# Patient Record
Sex: Female | Born: 1945 | Race: White | Hispanic: No | Marital: Married | Smoking: Never smoker
Health system: Southern US, Community
[De-identification: ages and names within clinical notes are randomized; demographics above are authoritative.]

## PROBLEM LIST (undated history)

## (undated) DIAGNOSIS — R112 Nausea with vomiting, unspecified: Secondary | ICD-10-CM

## (undated) DIAGNOSIS — J309 Allergic rhinitis, unspecified: Secondary | ICD-10-CM

## (undated) DIAGNOSIS — Z9889 Other specified postprocedural states: Secondary | ICD-10-CM

## (undated) DIAGNOSIS — C801 Malignant (primary) neoplasm, unspecified: Secondary | ICD-10-CM

## (undated) DIAGNOSIS — F419 Anxiety disorder, unspecified: Secondary | ICD-10-CM

## (undated) DIAGNOSIS — T8859XA Other complications of anesthesia, initial encounter: Secondary | ICD-10-CM

## (undated) DIAGNOSIS — T4145XA Adverse effect of unspecified anesthetic, initial encounter: Secondary | ICD-10-CM

## (undated) DIAGNOSIS — D696 Thrombocytopenia, unspecified: Secondary | ICD-10-CM

## (undated) DIAGNOSIS — R232 Flushing: Secondary | ICD-10-CM

## (undated) HISTORY — PX: PLANTAR FASCIA SURGERY: SHX746

## (undated) HISTORY — PX: LIMBAL STEM CELL TRANSPLANT: SHX1969

## (undated) HISTORY — PX: ABDOMINAL HYSTERECTOMY: SHX81

## (undated) HISTORY — PX: BACK SURGERY: SHX140

---

## 1998-07-06 ENCOUNTER — Other Ambulatory Visit: Admission: RE | Admit: 1998-07-06 | Discharge: 1998-07-06 | Payer: Self-pay | Admitting: *Deleted

## 1999-07-12 ENCOUNTER — Other Ambulatory Visit: Admission: RE | Admit: 1999-07-12 | Discharge: 1999-07-12 | Payer: Self-pay | Admitting: *Deleted

## 1999-12-13 ENCOUNTER — Encounter: Payer: Self-pay | Admitting: *Deleted

## 1999-12-13 ENCOUNTER — Encounter: Admission: RE | Admit: 1999-12-13 | Discharge: 1999-12-13 | Payer: Self-pay | Admitting: *Deleted

## 2000-07-17 ENCOUNTER — Other Ambulatory Visit: Admission: RE | Admit: 2000-07-17 | Discharge: 2000-07-17 | Payer: Self-pay | Admitting: *Deleted

## 2001-02-27 ENCOUNTER — Encounter: Admission: RE | Admit: 2001-02-27 | Discharge: 2001-02-27 | Payer: Self-pay | Admitting: *Deleted

## 2001-02-27 ENCOUNTER — Encounter: Payer: Self-pay | Admitting: *Deleted

## 2001-04-10 ENCOUNTER — Ambulatory Visit (HOSPITAL_COMMUNITY): Admission: RE | Admit: 2001-04-10 | Discharge: 2001-04-10 | Payer: Self-pay | Admitting: Family Medicine

## 2001-04-10 ENCOUNTER — Encounter: Payer: Self-pay | Admitting: Family Medicine

## 2001-04-11 ENCOUNTER — Encounter: Payer: Self-pay | Admitting: Family Medicine

## 2001-04-11 ENCOUNTER — Ambulatory Visit (HOSPITAL_COMMUNITY): Admission: RE | Admit: 2001-04-11 | Discharge: 2001-04-11 | Payer: Self-pay | Admitting: Family Medicine

## 2001-06-26 ENCOUNTER — Ambulatory Visit (HOSPITAL_COMMUNITY): Admission: RE | Admit: 2001-06-26 | Discharge: 2001-06-26 | Payer: Self-pay | Admitting: Gastroenterology

## 2001-06-26 ENCOUNTER — Encounter (INDEPENDENT_AMBULATORY_CARE_PROVIDER_SITE_OTHER): Payer: Self-pay | Admitting: Specialist

## 2001-08-15 ENCOUNTER — Other Ambulatory Visit: Admission: RE | Admit: 2001-08-15 | Discharge: 2001-08-15 | Payer: Self-pay | Admitting: *Deleted

## 2001-12-26 ENCOUNTER — Ambulatory Visit (HOSPITAL_COMMUNITY): Admission: RE | Admit: 2001-12-26 | Discharge: 2001-12-26 | Payer: Self-pay | Admitting: Family Medicine

## 2001-12-26 ENCOUNTER — Encounter: Payer: Self-pay | Admitting: Family Medicine

## 2002-05-22 ENCOUNTER — Encounter: Admission: RE | Admit: 2002-05-22 | Discharge: 2002-05-22 | Payer: Self-pay | Admitting: *Deleted

## 2002-10-06 ENCOUNTER — Encounter: Admission: RE | Admit: 2002-10-06 | Discharge: 2002-11-04 | Payer: Self-pay

## 2003-06-12 ENCOUNTER — Encounter: Admission: RE | Admit: 2003-06-12 | Discharge: 2003-06-12 | Payer: Self-pay | Admitting: *Deleted

## 2003-07-16 ENCOUNTER — Other Ambulatory Visit: Admission: RE | Admit: 2003-07-16 | Discharge: 2003-07-16 | Payer: Self-pay | Admitting: *Deleted

## 2004-02-09 ENCOUNTER — Encounter: Admission: RE | Admit: 2004-02-09 | Discharge: 2004-02-09 | Payer: Self-pay | Admitting: *Deleted

## 2004-06-07 ENCOUNTER — Ambulatory Visit (HOSPITAL_BASED_OUTPATIENT_CLINIC_OR_DEPARTMENT_OTHER): Admission: RE | Admit: 2004-06-07 | Discharge: 2004-06-07 | Payer: Self-pay | Admitting: *Deleted

## 2004-06-21 ENCOUNTER — Encounter: Admission: RE | Admit: 2004-06-21 | Discharge: 2004-06-21 | Payer: Self-pay | Admitting: *Deleted

## 2005-05-25 ENCOUNTER — Ambulatory Visit: Payer: Self-pay | Admitting: Family Medicine

## 2005-07-05 ENCOUNTER — Ambulatory Visit: Payer: Self-pay | Admitting: Family Medicine

## 2005-07-27 ENCOUNTER — Encounter: Admission: RE | Admit: 2005-07-27 | Discharge: 2005-07-27 | Payer: Self-pay | Admitting: *Deleted

## 2005-08-31 ENCOUNTER — Ambulatory Visit: Payer: Self-pay | Admitting: Family Medicine

## 2006-02-01 ENCOUNTER — Ambulatory Visit: Payer: Self-pay | Admitting: Family Medicine

## 2006-03-15 ENCOUNTER — Ambulatory Visit: Payer: Self-pay | Admitting: Family Medicine

## 2006-03-21 ENCOUNTER — Ambulatory Visit: Payer: Self-pay | Admitting: Family Medicine

## 2006-03-22 ENCOUNTER — Encounter (HOSPITAL_COMMUNITY): Admission: RE | Admit: 2006-03-22 | Discharge: 2006-06-20 | Payer: Self-pay | Admitting: Family Medicine

## 2006-03-26 ENCOUNTER — Ambulatory Visit: Payer: Self-pay | Admitting: Family Medicine

## 2006-04-05 ENCOUNTER — Ambulatory Visit: Payer: Self-pay | Admitting: Family Medicine

## 2006-04-06 ENCOUNTER — Ambulatory Visit: Payer: Self-pay | Admitting: Cardiology

## 2006-04-26 ENCOUNTER — Ambulatory Visit: Payer: Self-pay

## 2006-04-26 ENCOUNTER — Encounter: Payer: Self-pay | Admitting: Cardiology

## 2006-11-08 ENCOUNTER — Ambulatory Visit: Payer: Self-pay | Admitting: Family Medicine

## 2006-11-12 ENCOUNTER — Ambulatory Visit: Payer: Self-pay | Admitting: Family Medicine

## 2007-01-01 ENCOUNTER — Ambulatory Visit: Payer: Self-pay | Admitting: Family Medicine

## 2007-01-09 ENCOUNTER — Ambulatory Visit: Payer: Self-pay | Admitting: Oncology

## 2007-03-19 ENCOUNTER — Ambulatory Visit (HOSPITAL_BASED_OUTPATIENT_CLINIC_OR_DEPARTMENT_OTHER): Admission: RE | Admit: 2007-03-19 | Discharge: 2007-03-19 | Payer: Self-pay | Admitting: Orthopaedic Surgery

## 2007-03-19 ENCOUNTER — Encounter (INDEPENDENT_AMBULATORY_CARE_PROVIDER_SITE_OTHER): Payer: Self-pay | Admitting: Specialist

## 2007-03-28 ENCOUNTER — Ambulatory Visit: Payer: Self-pay | Admitting: Oncology

## 2007-03-29 ENCOUNTER — Ambulatory Visit (HOSPITAL_COMMUNITY): Admission: RE | Admit: 2007-03-29 | Discharge: 2007-03-29 | Payer: Self-pay | Admitting: Orthopaedic Surgery

## 2007-04-02 LAB — CBC & DIFF AND RETIC
BASO%: 0.7 % (ref 0.0–2.0)
Basophils Absolute: 0 10*3/uL (ref 0.0–0.1)
EOS%: 2.4 % (ref 0.0–7.0)
Eosinophils Absolute: 0.1 10*3/uL (ref 0.0–0.5)
MONO#: 0.3 10*3/uL (ref 0.1–0.9)
MONO%: 8.3 % (ref 0.0–13.0)
NEUT#: 2.4 10*3/uL (ref 1.5–6.5)
NEUT%: 63.3 % (ref 39.6–76.8)
RBC: 4.22 10*6/uL (ref 3.70–5.32)
Retic %: 0.5 % (ref 0.4–2.3)

## 2007-04-02 LAB — MORPHOLOGY

## 2007-04-04 LAB — COMPREHENSIVE METABOLIC PANEL
ALT: 13 U/L (ref 0–35)
AST: 14 U/L (ref 0–37)
BUN: 26 mg/dL — ABNORMAL HIGH (ref 6–23)
Calcium: 9.7 mg/dL (ref 8.4–10.5)
Potassium: 4.3 mEq/L (ref 3.5–5.3)
Sodium: 143 mEq/L (ref 135–145)
Total Protein: 7.7 g/dL (ref 6.0–8.3)

## 2007-04-04 LAB — LACTATE DEHYDROGENASE: LDH: 149 U/L (ref 94–250)

## 2007-04-04 LAB — IMMUNOFIXATION ELECTROPHORESIS: IgM, Serum: 388 mg/dL — ABNORMAL HIGH (ref 60–263)

## 2007-04-04 LAB — FERRITIN: Ferritin: 164 ng/mL (ref 10–291)

## 2007-04-17 LAB — PROTHROMBIN TIME
INR: 1 (ref 0.0–1.5)
Prothrombin Time: 13.3 seconds (ref 11.6–15.2)

## 2007-04-17 LAB — IVY BLEEDING TIME: Bleeding Time: 6.5 Minutes (ref 2.0–8.0)

## 2007-04-22 LAB — VON WILLEBRAND PANEL
Factor-VIII Activity: 59 % — ABNORMAL LOW (ref 75–150)
Ristocetin-Cofactor: 64 % (ref 50–150)
Von Willebrand Ag: 61 % normal (ref 61–164)

## 2007-05-10 ENCOUNTER — Ambulatory Visit: Payer: Self-pay | Admitting: Oncology

## 2007-05-14 ENCOUNTER — Encounter (HOSPITAL_COMMUNITY): Admission: RE | Admit: 2007-05-14 | Discharge: 2007-07-16 | Payer: Self-pay | Admitting: Oncology

## 2007-05-14 LAB — MORPHOLOGY
PLT EST: ADEQUATE
RBC Comments: NORMAL

## 2007-05-14 LAB — CBC WITH DIFFERENTIAL/PLATELET
EOS%: 2 % (ref 0.0–7.0)
Eosinophils Absolute: 0.1 10*3/uL (ref 0.0–0.5)
HCT: 33.3 % — ABNORMAL LOW (ref 34.8–46.6)
LYMPH%: 39.1 % (ref 14.0–48.0)
MCH: 31.8 pg (ref 26.0–34.0)
MCV: 90.8 fL (ref 81.0–101.0)
MONO%: 9.1 % (ref 0.0–13.0)
Platelets: 158 10*3/uL (ref 145–400)

## 2007-05-15 LAB — RHEUMATOID FACTOR: Rhuematoid fact SerPl-aCnc: <20 IU/mL (ref 0–20)

## 2007-05-17 LAB — ABO/RH

## 2007-05-18 LAB — VON WILLEBRAND FACTOR MULTIMER
Factor-VIII Activity: 48 % — ABNORMAL LOW (ref 75–150)
Ristocetin-Cofactor: 47 % — ABNORMAL LOW (ref 50–150)
Von Willebrand Ag: 51 % normal — ABNORMAL LOW (ref 61–164)

## 2007-06-13 LAB — VON WILLEBRAND PANEL
Factor-VIII Activity: 150 % (ref 75–150)
Ristocetin-Cofactor: >150 % — ABNORMAL HIGH (ref 50–150)

## 2007-06-18 ENCOUNTER — Ambulatory Visit (HOSPITAL_COMMUNITY): Admission: RE | Admit: 2007-06-18 | Discharge: 2007-06-18 | Payer: Self-pay | Admitting: Orthopaedic Surgery

## 2007-08-14 ENCOUNTER — Ambulatory Visit: Payer: Self-pay | Admitting: Oncology

## 2007-08-16 LAB — CBC WITH DIFFERENTIAL/PLATELET
BASO%: 0.6 % (ref 0.0–2.0)
Eosinophils Absolute: 0.1 10*3/uL (ref 0.0–0.5)
LYMPH%: 41 % (ref 14.0–48.0)
MONO#: 0.3 10*3/uL (ref 0.1–0.9)
NEUT#: 1.3 10*3/uL — ABNORMAL LOW (ref 1.5–6.5)
Platelets: 147 10*3/uL (ref 145–400)
RBC: 3.69 10*6/uL — ABNORMAL LOW (ref 3.70–5.32)
WBC: 2.7 10*3/uL — ABNORMAL LOW (ref 3.9–10.0)
lymph#: 1.1 10*3/uL (ref 0.9–3.3)

## 2007-08-16 LAB — MORPHOLOGY: PLT EST: ADEQUATE

## 2007-08-19 LAB — ANA: Anti Nuclear Antibody(ANA): POSITIVE — AB

## 2007-08-19 LAB — APTT: aPTT: 37 seconds (ref 24–37)

## 2007-08-19 LAB — ANTI-NUCLEAR AB-TITER (ANA TITER): ANA Titer 1: 1:80 {titer} — ABNORMAL HIGH

## 2007-11-18 ENCOUNTER — Ambulatory Visit: Payer: Self-pay | Admitting: Oncology

## 2007-11-20 LAB — CHCC SMEAR

## 2007-11-20 LAB — CBC WITH DIFFERENTIAL/PLATELET
Basophils Absolute: 0 10*3/uL (ref 0.0–0.1)
EOS%: 1.9 % (ref 0.0–7.0)
HGB: 11.7 g/dL (ref 11.6–15.9)
MCH: 31.1 pg (ref 26.0–34.0)
MCHC: 34.9 g/dL (ref 32.0–36.0)
MCV: 89.3 fL (ref 81.0–101.0)
MONO%: 7.5 % (ref 0.0–13.0)
RBC: 3.76 10*6/uL (ref 3.70–5.32)
RDW: 13.8 % (ref 11.3–14.5)

## 2007-11-20 LAB — MORPHOLOGY: PLT EST: ADEQUATE

## 2008-02-20 ENCOUNTER — Ambulatory Visit: Payer: Self-pay | Admitting: Oncology

## 2008-02-20 ENCOUNTER — Encounter: Payer: Self-pay | Admitting: Oncology

## 2008-02-20 ENCOUNTER — Ambulatory Visit (HOSPITAL_COMMUNITY): Admission: RE | Admit: 2008-02-20 | Discharge: 2008-02-20 | Payer: Self-pay | Admitting: Oncology

## 2008-03-11 ENCOUNTER — Encounter: Admission: RE | Admit: 2008-03-11 | Discharge: 2008-03-11 | Payer: Self-pay | Admitting: Family Medicine

## 2009-06-01 ENCOUNTER — Encounter: Admission: RE | Admit: 2009-06-01 | Discharge: 2009-06-01 | Payer: Self-pay | Admitting: Family Medicine

## 2011-01-01 ENCOUNTER — Encounter: Payer: Self-pay | Admitting: Family Medicine

## 2011-04-25 NOTE — Op Note (Signed)
Audrey Adams, Audrey Adams              ACCOUNT NO.:  [id number]   MEDICAL RECORD NO.:  [id number]          PATIENT TYPE:  AMB   LOCATION:  SDS                          FACILITY:  [facility]   PHYSICIAN:  Lubertha Basque. Dalldorf, M.D.DATE OF BIRTH:  1946-10-12   DATE OF PROCEDURE:  06/18/2007  DATE OF DISCHARGE:                               OPERATIVE REPORT   PREOPERATIVE DIAGNOSIS:  Right heel plantar fasciitis.   POSTOPERATIVE DIAGNOSIS:  Right heel plantar fasciitis.   PROCEDURE:  Right heel endoscopic plantar fascia release.   ANESTHESIA:  General.   ATTENDING SURGEON:  Lubertha Basque. Jerl Santos, M.D.   ASSISTANT:  Lindwood Qua, P.A.   INDICATIONS FOR PROCEDURE:  The patient is a 65 year old woman with a  many-year history of bilateral foot pain.  This has persisted despite  all known conservative means of treatment including bracing, pads,  orthotics, physical therapy, ultrasound treatments, and multiple  injections.  She is status post a successful procedure on the opposite  foot which was an endoscopic plantar fascia release along with a neuroma  excision.  She did have some bleeding complications related to the  neuroma and has since been diagnosed with Von Willebrand's disease.  She  is now offered an endoscopic plantar fascia release on the right where  she has a great deal of heel pain.  Informed operative consent was  obtained after discussion of the possible complications of reaction to  anesthesia, infection, neurovascular injury, and obviously bleeding.  At  the direction of her hematologist she was administered some DDAVP prior  to this procedure in the holding area.   SUMMARY OF FINDINGS AND PROCEDURE:  Under general anesthesia through two  portals, an endoscopic plantar fascial release was performed on the  right.  She had findings consistent with chronic plantar fasciitis, and  this was released using the Topaz machine.   DESCRIPTION OF PROCEDURE:  The patient was taken to  the operating suite  where general anesthetic was applied without difficulty.  She was  positioned supine and prepped and draped in normal sterile fashion.  After administration of brief IV Kefzol and the aforementioned DDAVP,  the right leg was elevated, exsanguinated, and a tourniquet inflated  about the calf.  A medial portal incision was made near the plantar  fascia origin with dissection down to this structure.  An endoscope was  then placed above the fascia.  We then used the Topaz device to perform  perforations of the plantar fascia in 10 to 15 locations through the  endoscope.  The endoscope was removed.  I did release the medial border  of the plantar fascia with some scissors.  The tourniquet was deflated,  and a small amount of bleeding was easily controlled with pressure.  The  toes all became pink and warm immediately.  We reapproximated the  portals with simple sutures of nylon followed by Adaptic and a dry gauze  dressing with a loose Ace wrap.  Estimated blood loss and intraoperative  fluids can be obtained from anesthesia records.   DISPOSITION:  The patient was extubated in the operating room and  taken  to the recovery room in stable condition.  She was to go home same-day  and follow up in the office next week.  I will contact her by phone  tonight.      Lubertha Basque Jerl Santos, M.D.  Electronically Signed     PGD/MEDQ  D:  06/18/2007  T:  06/18/2007  Job:  [id number]

## 2011-04-25 NOTE — Op Note (Signed)
NAMELANDRIE, BEALE NO.:  [id number]   MEDICAL RECORD NO.:  [id number]          PATIENT TYPE:  OUT   LOCATION:  OMED                         FACILITY:  [facility]   PHYSICIAN:  Genene Churn. Granfortuna, M.D.DATE OF BIRTH:  Apr 05, 1946   DATE OF PROCEDURE:  02/20/2008  DATE OF DISCHARGE:                               OPERATIVE REPORT   PROCEDURE:  Right posterior iliac crest bone marrow aspiration and  biopsy done with local 2% Lidocaine anesthesia and 5 mg IV Versed  premedication without complication. Pre-procedure evaluation of airway  reveals class I airway.   INDICATIONS FOR PROCEDURE:  Unexplained leukopenia in a 65 year old  woman who also has a diagnosis of type 1 von Willebrand's disorder.  There were no complications.      Genene Churn. Cyndie Chime, M.D.  Electronically Signed     JMG/MEDQ  D:  02/20/2008  T:  02/20/2008  Job:  [id number]

## 2011-04-28 NOTE — Op Note (Signed)
NAMEANASTACIA, REINECKE              ACCOUNT NO.:  [id number]   MEDICAL RECORD NO.:  [id number]          PATIENT TYPE:  AMB   LOCATION:  DSC                          FACILITY:  [facility]   PHYSICIAN:  Lubertha Basque. Dalldorf, M.D.DATE OF BIRTH:  Jan 13, 1946   DATE OF PROCEDURE:  03/19/2007  DATE OF DISCHARGE:                               OPERATIVE REPORT   PREOPERATIVE DIAGNOSES:  1. Left foot Morton's neuroma.  2. Left foot plantar fasciitis.  3. Left thumb basal joint degeneration.   POSTOPERATIVE DIAGNOSES:  1. Left foot Morton's neuroma.  2. Left foot plantar fasciitis.  3. Left thumb basal joint degeneration.   PROCEDURE:  1. Left foot third interspace Morton's neuroma excision.  2. Left foot endoscopic plantar fascial release.  3. Left thumb basal joint injection.   ANESTHESIA:  General.   ATTENDING SURGEON:  Lubertha Basque. Jerl Santos, M.D.   ASSISTANT:  Lindwood Qua, P.A.   INDICATIONS FOR PROCEDURE:  The patient is a 65 year old woman with a  long history of left foot pain.  She has persisted with difficulty  despite oral anti-inflammatories which led to an ulcer.  She has also  had several sets of orthotics reworked.  She has had three injections in  the plantar fascial region.  She has pain which continues to limit her  ability to walk.  She also has forefoot pain in the third interspace and  difficulty wearing shoes.  She is offered an endoscopic plantar fascial  release and excision of her neuroma in hopes of ameliorating her  symptoms somewhat.  We did discuss the risks of anesthesia, infection,  neurovascular injury as well as recurrence.  She also wishes to have an  injection about her arthritic basal joint at the left thumb.   SUMMARY, FINDINGS, AND PROCEDURE:  Under general anesthesia, several  procedures were performed.  After sterile prep, we injected the basal  joint at the left thumb with Depo-Medrol and lidocaine.  We then  performed an endoscopic plantar fascial  release and did utilize the  [employer].  I then removed a neuroma from the third interspace  through a dorsal incision.  This was sent to pathology.   DESCRIPTION OF PROCEDURE:  The patient was taken to the operating suite  where general anesthetic was applied without difficulty.  She was  positioned supine and prepped and draped in the normal sterile fashion.  After administration of IV Kefzol, an injection of the left basal joint  was done after an alcohol prep.  We placed 1 mL of Depo-Medrol and a  small amount of lidocaine.  A Band-Aid was applied.  We then turned our  attention to her foot.  A small medial incision was made in the area of  the plantar fascia, and a trocar was introduced across to the lateral  border of the heel, and then a small incision was made there to allow  passage of the trocar out that side of the heel.  We visualized the  plantar fascia well.  I then used the Topaz machine by [employer] and  utilized this to stimulate the plantar  fascia in several areas.  I also  used some scissors to release the medial border of the plantar fascia.  The endoscope was then removed.  We then made a dorsal incision in the  third interspace, with dissection down to a very prominent Morton's  neuroma.  I removed this from just proximal to the metatarsal head out  past the bifurcation.  This was sent to pathology in formalin.  We did  inflate a tourniquet at the beginning of the case about the calf, and I  released that at this point.  A small amount of bleeding was easily  controlled with pressure and Bovie cautery.  Simple sutures of nylon  were used to loosely reapproximate the plantar fascial incisions, and  nylon was also used to reapproximate the skin at the Morton's neuroma  incision site.  Adaptic was placed on the wounds followed by dry gauze  and a loose Ace wrap.  Estimated blood loss, intraoperative fluids, as  well as accurate tourniquet time can be obtained from  anesthesia  records.   DISPOSITION:  The patient was extubated in the operating room and taken  to the recovery in stable addition.  She was to go home same-day and  follow up in the office next week.  I will contact her by phone tonight.      Lubertha Basque Jerl Santos, M.D.  Electronically Signed     PGD/MEDQ  D:  03/19/2007  T:  03/19/2007  Job:  [id number]

## 2011-05-22 ENCOUNTER — Ambulatory Visit: Payer: Self-pay | Admitting: Physical Therapy

## 2011-05-29 ENCOUNTER — Ambulatory Visit: Payer: BC Managed Care – PPO | Attending: Internal Medicine | Admitting: Physical Therapy

## 2011-05-29 DIAGNOSIS — M545 Low back pain, unspecified: Secondary | ICD-10-CM | POA: Insufficient documentation

## 2011-05-29 DIAGNOSIS — IMO0001 Reserved for inherently not codable concepts without codable children: Secondary | ICD-10-CM | POA: Insufficient documentation

## 2011-05-29 DIAGNOSIS — M546 Pain in thoracic spine: Secondary | ICD-10-CM | POA: Insufficient documentation

## 2011-05-29 DIAGNOSIS — R5381 Other malaise: Secondary | ICD-10-CM | POA: Insufficient documentation

## 2011-05-31 ENCOUNTER — Ambulatory Visit: Payer: BC Managed Care – PPO | Admitting: Physical Therapy

## 2011-06-05 ENCOUNTER — Ambulatory Visit: Payer: BC Managed Care – PPO | Admitting: Physical Therapy

## 2011-06-08 ENCOUNTER — Ambulatory Visit: Payer: BC Managed Care – PPO | Admitting: Physical Therapy

## 2011-06-15 ENCOUNTER — Ambulatory Visit: Payer: BC Managed Care – PPO | Attending: Internal Medicine | Admitting: *Deleted

## 2011-06-15 DIAGNOSIS — M545 Low back pain, unspecified: Secondary | ICD-10-CM | POA: Insufficient documentation

## 2011-06-15 DIAGNOSIS — M546 Pain in thoracic spine: Secondary | ICD-10-CM | POA: Insufficient documentation

## 2011-06-15 DIAGNOSIS — IMO0001 Reserved for inherently not codable concepts without codable children: Secondary | ICD-10-CM | POA: Insufficient documentation

## 2011-06-15 DIAGNOSIS — R5381 Other malaise: Secondary | ICD-10-CM | POA: Insufficient documentation

## 2011-06-20 ENCOUNTER — Ambulatory Visit: Payer: BC Managed Care – PPO | Admitting: Physical Therapy

## 2011-09-04 LAB — CBC
HCT: 35.1 — ABNORMAL LOW
Hemoglobin: 12
RBC: 3.92

## 2011-09-04 LAB — DIFFERENTIAL
Eosinophils Relative: 1
Lymphocytes Relative: 34
Lymphs Abs: 1
Monocytes Absolute: 0.2
Monocytes Relative: 7
Neutro Abs: 1.7

## 2011-09-04 LAB — TISSUE HYBRIDIZATION (BONE MARROW)-NCBH

## 2011-09-26 LAB — CBC
HCT: 34.7 — ABNORMAL LOW
Hemoglobin: 11.9 — ABNORMAL LOW
MCV: 90.4
RBC: 3.84 — ABNORMAL LOW
WBC: 3 — ABNORMAL LOW

## 2011-09-26 LAB — BASIC METABOLIC PANEL
Chloride: 105
GFR calc Af Amer: >60
Potassium: 4.5
Sodium: 139

## 2011-09-28 LAB — ABO/RH: ABO/RH(D): O POS

## 2012-03-04 ENCOUNTER — Encounter (HOSPITAL_BASED_OUTPATIENT_CLINIC_OR_DEPARTMENT_OTHER): Payer: BC Managed Care – PPO

## 2015-02-19 ENCOUNTER — Emergency Department (HOSPITAL_BASED_OUTPATIENT_CLINIC_OR_DEPARTMENT_OTHER)
Admission: EM | Admit: 2015-02-19 | Discharge: 2015-02-19 | Disposition: A | Discharge status: Home / Self Care | Payer: Medicare Other | Attending: Emergency Medicine | Admitting: Emergency Medicine

## 2015-02-19 ENCOUNTER — Encounter (HOSPITAL_BASED_OUTPATIENT_CLINIC_OR_DEPARTMENT_OTHER): Payer: Self-pay | Admitting: *Deleted

## 2015-02-19 ENCOUNTER — Emergency Department (HOSPITAL_BASED_OUTPATIENT_CLINIC_OR_DEPARTMENT_OTHER): Payer: Medicare Other

## 2015-02-19 DIAGNOSIS — Z87448 Personal history of other diseases of urinary system: Secondary | ICD-10-CM | POA: Diagnosis not present

## 2015-02-19 DIAGNOSIS — J209 Acute bronchitis, unspecified: Secondary | ICD-10-CM | POA: Insufficient documentation

## 2015-02-19 DIAGNOSIS — J40 Bronchitis, not specified as acute or chronic: Secondary | ICD-10-CM

## 2015-02-19 DIAGNOSIS — Z8659 Personal history of other mental and behavioral disorders: Secondary | ICD-10-CM | POA: Insufficient documentation

## 2015-02-19 DIAGNOSIS — R05 Cough: Secondary | ICD-10-CM | POA: Diagnosis present

## 2015-02-19 DIAGNOSIS — Z8572 Personal history of non-Hodgkin lymphomas: Secondary | ICD-10-CM | POA: Insufficient documentation

## 2015-02-19 HISTORY — DX: Anxiety disorder, unspecified: F41.9

## 2015-02-19 HISTORY — DX: Flushing: R23.2

## 2015-02-19 HISTORY — DX: Malignant (primary) neoplasm, unspecified: C80.1

## 2015-02-19 HISTORY — DX: Allergic rhinitis, unspecified: J30.9

## 2015-02-19 MED ORDER — ALBUTEROL SULFATE HFA 108 (90 BASE) MCG/ACT IN AERS
1.0000 | INHALATION_SPRAY | RESPIRATORY_TRACT | Status: DC | PRN
  Filled 2015-02-19 (×2): qty 6.7

## 2015-02-19 MED ORDER — AZITHROMYCIN 250 MG PO TABS: 250.0000 mg | ORAL_TABLET | Freq: Every day | ORAL | Status: DC

## 2015-02-19 MED ORDER — CEFTRIAXONE SODIUM 1 G IJ SOLR
1.0000 g | Freq: Once | INTRAMUSCULAR | Status: AC
  Administered 2015-02-19: 1 g via INTRAMUSCULAR
  Filled 2015-02-19: qty 10

## 2015-02-19 MED ORDER — LIDOCAINE HCL (PF) 1 % IJ SOLN
INTRAMUSCULAR | Status: AC
  Filled 2015-02-19: qty 5

## 2015-02-19 NOTE — ED Notes (Signed)
Cough and congestion x 4 days. [facility] further evaluation.

## 2015-02-20 NOTE — ED Provider Notes (Signed)
CSN: [id number]     Arrival date & time 02/19/15  1124 History   First MD Initiated Contact with Patient 02/19/15 1505     Chief Complaint  Patient presents with  . Cough     (Consider location/radiation/quality/duration/timing/severity/associated sxs/prior Treatment) Patient is a 69 y.o. female presenting with cough. The history is provided by the patient.  Cough Cough characteristics:  Productive Sputum characteristics:  Yellow Severity:  Mild Onset quality:  Gradual Duration:  4 days Timing:  Constant Progression:  Worsening Chronicity:  New Smoker: no   Context: sick contacts and upper respiratory infection   Relieved by:  Nothing Worsened by:  Nothing tried Ineffective treatments: nasal saline. Associated symptoms: fever, rhinorrhea, shortness of breath and sinus congestion   Associated symptoms: no chest pain, no chills, no headaches and no wheezing   Risk factors: no recent infection and no recent travel     Past Medical History  Diagnosis Date  . Anxiety   . Hot flashes   . Allergic rhinitis   . Cancer     lymphoma 2011   Past Surgical History  Procedure Laterality Date  . Limbal stem cell transplant    . Abdominal hysterectomy    . Back surgery     No family history on file. History  Substance Use Topics  . Smoking status: Never Smoker   . Smokeless tobacco: Not on file  . Alcohol Use: No   OB History    No data available     Review of Systems  Constitutional: Positive for fever. Negative for chills.  HENT: Positive for postnasal drip and rhinorrhea.   Respiratory: Positive for cough and shortness of breath. Negative for wheezing.   Cardiovascular: Negative for chest pain.  Neurological: Negative for headaches.  All other systems reviewed and are negative.     Allergies  Allopurinol; Bactrim; Codeine; Percocet; and Tegretol  Home Medications   Prior to Admission medications   Medication Sig Start Date End Date Taking? Authorizing  Provider  azithromycin (ZITHROMAX) 250 MG tablet Take 1 tablet (250 mg total) by mouth daily. Take first 2 tablets together, then 1 every day until finished. 02/19/15   Blanchie Dessert, MD   BP 124/78 mmHg  Pulse 67  Temp(Src) 98.4 F (36.9 C) (Oral)  Resp 16  Ht 5\' 10"  (1.778 m)  Wt 160 lb (72.576 kg)  BMI 22.96 kg/m2  SpO2 100% Physical Exam  Constitutional: She is oriented to person, place, and time. She appears well-developed and well-nourished. No distress.  HENT:  Head: Normocephalic and atraumatic.  Right Ear: Tympanic membrane and ear canal normal.  Left Ear: Tympanic membrane and ear canal normal.  Nose: Mucosal edema and rhinorrhea present.  Mouth/Throat: Oropharynx is clear and moist and mucous membranes are normal.  Eyes: EOM are normal. Pupils are equal, round, and reactive to light.  Cardiovascular: Normal rate, regular rhythm, normal heart sounds and intact distal pulses.  Exam reveals no friction rub.   No murmur heard. Pulmonary/Chest: Effort normal and breath sounds normal. She has no wheezes. She has no rales. She exhibits no tenderness.  Abdominal: Soft. Bowel sounds are normal. She exhibits no distension. There is no tenderness. There is no rebound and no guarding.  Musculoskeletal: Normal range of motion. She exhibits no tenderness.  No edema  Neurological: She is alert and oriented to person, place, and time. No cranial nerve deficit.  Skin: Skin is warm and dry. No rash noted.  Psychiatric: She has a normal mood  and affect. Her behavior is normal.  Nursing note and vitals reviewed.   ED Course  Procedures (including critical care time) Labs Review Labs Reviewed - No data to display  Imaging Review Dg Chest 2 View  02/19/2015   CLINICAL DATA:  Cough and congestion for 4 days, history of prior lymphoma  EXAM: CHEST  2 VIEW  COMPARISON:  12/09/2013  FINDINGS: The heart size and mediastinal contours are within normal limits. Both lungs are clear. The  visualized skeletal structures are unremarkable.  IMPRESSION: No active cardiopulmonary disease.   Electronically Signed   By: Inez Catalina M.D.   On: 02/19/2015 12:44     EKG Interpretation None      MDM   Final diagnoses:  Bronchitis    Patient here with prior history of stem cell transplant who presents today with 4 days of worsening cough, congestion and now shortness of breath. Patient's vital signs are within normal limits currently afebrile here. Patient did receive a flu shot this year. Chest x-ray within normal limits. Patient given antibiotics and discharged home to follow-up with her regular doctor.    Blanchie Dessert, MD 02/20/15 [phone]

## 2016-02-10 ENCOUNTER — Other Ambulatory Visit: Payer: Self-pay | Admitting: Otolaryngology

## 2016-02-10 ENCOUNTER — Ambulatory Visit
Admission: RE | Admit: 2016-02-10 | Discharge: 2016-02-10 | Disposition: A | Discharge status: Home / Self Care | Payer: Medicare Other | Source: Ambulatory Visit | Attending: Otolaryngology | Admitting: Otolaryngology

## 2016-02-10 DIAGNOSIS — J018 Other acute sinusitis: Secondary | ICD-10-CM

## 2017-04-25 ENCOUNTER — Ambulatory Visit: Payer: Medicare Other | Attending: Orthopedic Surgery | Admitting: Physical Therapy

## 2017-04-25 DIAGNOSIS — M542 Cervicalgia: Secondary | ICD-10-CM | POA: Diagnosis not present

## 2017-04-25 DIAGNOSIS — R293 Abnormal posture: Secondary | ICD-10-CM

## 2017-04-25 NOTE — Therapy (Signed)
[facility] [place], [place], [address] Phone: [phone]   Fax:  [phone]  Physical Therapy Evaluation  Patient Details  Name: KALYNN DECLERCQ MRN: [id number] Date of Birth: September 03, 1946 Referring Provider: Tania Ade MD  Encounter Date: 04/25/2017      PT End of Session - 04/25/17 1404    Activity Tolerance Patient tolerated treatment well   Behavior During Therapy [facility] for tasks assessed/performed      Past Medical History:  Diagnosis Date  . Allergic rhinitis   . Anxiety   . Cancer    lymphoma 2011  . Hot flashes     Past Surgical History:  Procedure Laterality Date  . ABDOMINAL HYSTERECTOMY    . BACK SURGERY    . LIMBAL STEM CELL TRANSPLANT      There were no vitals filed for this visit.       Subjective Assessment - 04/25/17 1251    Subjective The patient presents to OPPT with c/o right sided neck pain.  She also reports tingling into her left hand.  Without medication her pain is an 9-10/10.  Increased activity increase pain and Ibuprofen   Pertinent History Previous right shoulder injury.   Diagnostic tests X-ry.   Patient Stated Goals Want to ger out of pain so I can do ADL's.   Currently in Pain? Yes   Pain Score 10-Worst pain ever   Pain Location Neck   Pain Orientation Right   Pain Descriptors / Indicators Aching;Tingling;Numbness   Pain Onset More than a month ago   Pain Frequency Constant   Aggravating Factors  See above.   Pain Relieving Factors See above.            [facility] PT Assessment - 04/25/17 0001      Assessment   Medical Diagnosis Cervical radiculopathy.   Referring Provider Tania Ade MD   Onset Date/Surgical Date --  4 weeks.   Hand Dominance Right     Precautions   Precautions None     Restrictions   Weight Bearing Restrictions No     Balance Screen   Has the patient fallen in the past 6 months No   Has the patient had a decrease in activity  level because of a fear of falling?  No   Is the patient reluctant to leave their home because of a fear of falling?  No     Home Environment   Living Environment Private residence     Prior Function   Level of Independence Independent     Posture/Postural Control   Posture/Postural Control Postural limitations   Postural Limitations Rounded Shoulders;Forward head     ROM / Strength   AROM / PROM / Strength AROM;Strength     AROM   Overall AROM Comments Left active cervical rotation= 60 degrees, right rotation= 60 degrees; right SBinf= 5 degrees and left SBing= 15 degrees.     Strength   Overall Strength Comments Right and left grip normal.  Right shoulder elbow and wrist strength is essentially normal.     Palpation   Palpation comment CC is pain with deep palpation along the right lateral lower cervical spine region.     Special Tests    Special Tests --  Bil Tricep reflexes decreased but others normal.     Ambulation/Gait   Gait Comments WNL.                   [facility] PT Treatment/Exercise -  04/25/17 0001      Modalities   Modalities Traction     Traction   Type of Traction Cervical   Min (lbs) 5   Max (lbs) 15   Hold Time 99   Rest Time 15                PT Education - 04/25/17 1305    Education provided Yes   Person(s) Educated Patient   Methods Explanation;Demonstration;Verbal cues   Comprehension Verbalized understanding;Returned demonstration          PT Short Term Goals - 04/25/17 1330      PT SHORT TERM GOAL #1   Title STG's=LTG's.           PT Long Term Goals - 04/25/17 1359      PT LONG TERM GOAL #1   Title Independent with a HEP.   Time 6   Period Weeks   Status New     PT LONG TERM GOAL #2   Title Increase active cervical rotation to 70 degrees+ so patient can turn head more easily while driving.   Time 6   Period Weeks   Status New     PT LONG TERM GOAL #3   Title Eliminate right UE symptoms.    Time 6   Period Weeks   Status New     PT LONG TERM GOAL #4   Title Perform ADL's with pain not > 2/10.   Time 6   Period Weeks   Status New               Plan - 04/25/17 1310    Clinical Impression Statement The patient presents to OPPT with c/o right sided neck pain with tingling that goes to her right hand.  her cervical range of motion is limited and painful.  She likes working outdoors but cannot do so like she would like due to at times severe times.  Patient will benefit from skilled physical therapy.   Rehab Potential Good   PT Frequency 2x / week   PT Duration 6 weeks   PT Treatment/Interventions ADLs/Self Care Home Management;Electrical Stimulation;Traction;Therapeutic activities;Therapeutic exercise;Patient/family education;Manual techniques;Passive range of motion;Dry needling   PT Next Visit Plan Int traction at 18# with max at 23-25# based on patient tolerance; STW/M to right cervical musculature; postural exercises and active cervical range of motion.   Consulted and Agree with Plan of Care Patient      Patient will benefit from skilled therapeutic intervention in order to improve the following deficits and impairments:  Pain, Decreased activity tolerance, Decreased range of motion, Postural dysfunction  Visit Diagnosis: Cervicalgia - Plan: PT plan of care cert/re-cert  Abnormal posture - Plan: PT plan of care cert/re-cert      G-Codes - 35/36/14 1246    Functional Assessment Tool Used (Outpatient Only) Clinical judgement....   Functional Limitation Self care   Self Care Current Status [phone]) At least 20 percent but less than 40 percent impaired, limited or restricted   Self Care Goal Status ([id number]) At least 1 percent but less than 20 percent impaired, limited or restricted       Problem List There are no active problems to display for this patient.   Nai Borromeo, [place] MPT 04/25/2017, 2:16 PM  [facility] [address] [place], [place], [address] Phone: [phone]   Fax:  [phone]  Name: LURIA ROSARIO MRN: [id number] Date of Birth: March 11, 1946

## 2017-04-25 NOTE — Patient Instructions (Signed)
Instructed the patient in chin tucks and cervical extension.

## 2017-04-30 ENCOUNTER — Encounter: Payer: Self-pay | Admitting: Physical Therapy

## 2017-04-30 ENCOUNTER — Ambulatory Visit: Payer: Medicare Other | Admitting: Physical Therapy

## 2017-04-30 DIAGNOSIS — R293 Abnormal posture: Secondary | ICD-10-CM

## 2017-04-30 DIAGNOSIS — M542 Cervicalgia: Secondary | ICD-10-CM | POA: Diagnosis not present

## 2017-04-30 NOTE — Therapy (Signed)
[facility] [place], [place], [address] Phone: [phone]   Fax:  [phone]  Physical Therapy Treatment  Patient Details  Name: Audrey Adams MRN: [id number] Date of Birth: Jun 23, 1946 Referring Provider: Tania Ade MD  Encounter Date: 04/30/2017      PT End of Session - 04/30/17 1150    Visit Number 2   Number of Visits 12   Date for PT Re-Evaluation 06/06/17   PT Start Time 1116   PT Stop Time 1201   PT Time Calculation (min) 45 min   Activity Tolerance Patient tolerated treatment well   Behavior During Therapy [facility] for tasks assessed/performed      Past Medical History:  Diagnosis Date  . Allergic rhinitis   . Anxiety   . Cancer ([place])    lymphoma 2011  . Hot flashes     Past Surgical History:  Procedure Laterality Date  . ABDOMINAL HYSTERECTOMY    . BACK SURGERY    . LIMBAL STEM CELL TRANSPLANT      There were no vitals filed for this visit.      Subjective Assessment - 04/30/17 1121    Subjective Patinet did fair after last treatment no improvement thus far   Pertinent History Previous right shoulder injury.   Diagnostic tests X-ry.   Patient Stated Goals Want to ger out of pain so I can do ADL's.   Currently in Pain? Yes   Pain Score 10-Worst pain ever   Pain Location Neck   Pain Orientation Right   Pain Descriptors / Indicators Aching;Tingling;Numbness   Pain Type Acute pain   Pain Onset More than a month ago   Pain Frequency Constant   Aggravating Factors  any activity   Pain Relieving Factors at rest                         [facility] Adult PT Treatment/Exercise - 04/30/17 0001      Traction   Type of Traction Cervical   Min (lbs) 5   Max (lbs) 17   Hold Time 99   Rest Time 15     Manual Therapy   Manual Therapy Soft tissue mobilization;Myofascial release   Soft tissue mobilization manual STW to bil c-spine and right UT/levator and posterior cap area                 PT Education - 04/30/17 1150    Education provided Yes   Education Details HEP   Person(s) Educated Patient   Methods Explanation;Demonstration;Handout   Comprehension Verbalized understanding;Returned demonstration          PT Short Term Goals - 04/25/17 1330      PT SHORT TERM GOAL #1   Title STG's=LTG's.           PT Long Term Goals - 04/30/17 1152      PT LONG TERM GOAL #1   Title Independent with a HEP.   Time 6   Period Weeks   Status On-going     PT LONG TERM GOAL #2   Title Increase active cervical rotation to 70 degrees+ so patient can turn head more easily while driving.   Time 6   Period Weeks   Status On-going     PT LONG TERM GOAL #3   Title Eliminate right UE symptoms.   Time 6   Period Weeks   Status On-going     PT LONG TERM GOAL #4  Title Perform ADL's with pain not > 2/10.   Time 6   Period Weeks   Status On-going               Plan - 04/30/17 1153    Clinical Impression Statement Patient tolerated treatemtent well today. Patient had tightness and palpable pain on right UT/levator. Patient given HEP today. Increased cervical traction per MPT today. Goals ongoing due to pain and ROM deficits.   Rehab Potential Good   PT Frequency 2x / week   PT Duration 6 weeks   PT Treatment/Interventions ADLs/Self Care Home Management;Electrical Stimulation;Traction;Therapeutic activities;Therapeutic exercise;Patient/family education;Manual techniques;Passive range of motion;Dry needling   PT Next Visit Plan Int traction at 18# with max at 23-25# based on patient tolerance; STW/M to right cervical musculature; postural exercises and active cervical range of motion.   Consulted and Agree with Plan of Care Patient      Patient will benefit from skilled therapeutic intervention in order to improve the following deficits and impairments:  Pain, Decreased activity tolerance, Decreased range of motion, Postural  dysfunction  Visit Diagnosis: Cervicalgia  Abnormal posture     Problem List There are no active problems to display for this patient.   Phillips Climes, PTA 04/30/2017, 12:06 PM  [facility] [address] [place], [place], [address] Phone: [phone]   Fax:  [phone]  Name: Audrey Adams MRN: [id number] Date of Birth: 04/25/1946

## 2017-04-30 NOTE — Patient Instructions (Signed)
AROM: Neck Rotation   Turn head slowly to look over one shoulder, then the other. Hold each position _10___ seconds. Repeat _5___ times per set. Do __2__ sets per session. Do _2-3___ sessions per day.   AROM: Lateral Neck Flexion   Slowly tilt head toward one shoulder, then the other. Hold each position _10___ seconds. Repeat __5__ times per set. Do __2__ sets per session. Do __2-3__ sessions per day.   Stretch Break - Chin Tuck   Looking straight forward, tuck chin and hold __10__ seconds. Relax and return to starting position. Repeat __5-10__ times every _3-4___ hours.  Stretch Break - Chest and Shoulder Stretch   Maintaining erect posture, draw shoulders back while bringing elbows back and inward. Return to starting position. Repeat __10-20__ times every _3-4___ hours.    

## 2017-05-02 ENCOUNTER — Ambulatory Visit: Payer: Medicare Other | Admitting: Physical Therapy

## 2017-05-02 ENCOUNTER — Encounter: Payer: Self-pay | Admitting: Physical Therapy

## 2017-05-02 DIAGNOSIS — M542 Cervicalgia: Secondary | ICD-10-CM | POA: Diagnosis not present

## 2017-05-02 DIAGNOSIS — R293 Abnormal posture: Secondary | ICD-10-CM

## 2017-05-02 NOTE — Therapy (Signed)
[facility] [place], [place], [address] Phone: [phone]   Fax:  [phone]  Physical Therapy Treatment  Patient Details  Name: [place] Adams MRN: [id number] Date of Birth: Feb 26, 1946 Referring Provider: Tania Ade MD  Encounter Date: 05/02/2017      PT End of Session - 05/02/17 1021    Visit Number 3   Number of Visits 12   Date for PT Re-Evaluation 06/06/17   PT Start Time 0946   PT Stop Time 1028   PT Time Calculation (min) 42 min   Activity Tolerance Patient tolerated treatment well   Behavior During Therapy [facility] for tasks assessed/performed      Past Medical History:  Diagnosis Date  . Allergic rhinitis   . Anxiety   . Cancer ([place])    lymphoma 2011  . Hot flashes     Past Surgical History:  Procedure Laterality Date  . ABDOMINAL HYSTERECTOMY    . BACK SURGERY    . LIMBAL STEM CELL TRANSPLANT      There were no vitals filed for this visit.      Subjective Assessment - 05/02/17 1013    Subjective Patient reported no relief thus far   Pertinent History Previous right shoulder injury.   Diagnostic tests X-ry.   Patient Stated Goals Want to ger out of pain so I can do ADL's.   Currently in Pain? Yes   Pain Score 10-Worst pain ever   Pain Location Neck   Pain Orientation Right   Pain Descriptors / Indicators Aching;Tingling;Numbness   Pain Type Acute pain   Pain Onset More than a month ago   Pain Frequency Constant   Aggravating Factors  cervical rotation and side bending   Pain Relieving Factors at rest                         [facility] Adult PT Treatment/Exercise - 05/02/17 0001      Traction   Type of Traction Cervical   Min (lbs) 5   Max (lbs) 18   Hold Time 99   Rest Time 15     Manual Therapy   Manual Therapy Soft tissue mobilization;Myofascial release;Passive ROM   Manual therapy comments Patient supine   Soft tissue mobilization manual STW to bil c-spine  and right UT/levator    Myofascial Release trigger point release to supoccipitals and right UT    Passive ROM manual stretching and ROM for levator/UT /rotation and side bending movements gentle range                  PT Short Term Goals - 04/25/17 1330      PT SHORT TERM GOAL #1   Title STG's=LTG's.           PT Long Term Goals - 04/30/17 1152      PT LONG TERM GOAL #1   Title Independent with a HEP.   Time 6   Period Weeks   Status On-going     PT LONG TERM GOAL #2   Title Increase active cervical rotation to 70 degrees+ so patient can turn head more easily while driving.   Time 6   Period Weeks   Status On-going     PT LONG TERM GOAL #3   Title Eliminate right UE symptoms.   Time 6   Period Weeks   Status On-going     PT LONG TERM GOAL #4   Title Perform  ADL's with pain not > 2/10.   Time 6   Period Weeks   Status On-going               Plan - 05/02/17 1024    Clinical Impression Statement Patient tolerated treatment well today. Increased cervical traction with no complaints. Today focused on supine cervical ROM, stretching and trigger point release to help decrease pain and tight muscles today. Patient has felt no lasting relief thus far. Goals ongoing due to pain and ROM deficts.    Rehab Potential Good   PT Frequency 2x / week   PT Duration 6 weeks   PT Treatment/Interventions ADLs/Self Care Home Management;Electrical Stimulation;Traction;Therapeutic activities;Therapeutic exercise;Patient/family education;Manual techniques;Passive range of motion;Dry needling   PT Next Visit Plan cont with POC for 20# cervical traction next treatment and manual STW, ROM and stretching to cervical spine in supine   Consulted and Agree with Plan of Care Patient      Patient will benefit from skilled therapeutic intervention in order to improve the following deficits and impairments:  Pain, Decreased activity tolerance, Decreased range of motion, Postural  dysfunction  Visit Diagnosis: Cervicalgia  Abnormal posture     Problem List There are no active problems to display for this patient.   Phillips Climes , PTA 05/02/2017, 10:32 AM  [facility] [place], [place], [address] Phone: [phone]   Fax:  [phone]  Name: [place] MRN: [id number] Date of Birth: 04/28/1946

## 2017-05-04 ENCOUNTER — Ambulatory Visit: Payer: Medicare Other | Admitting: Physical Therapy

## 2017-05-04 ENCOUNTER — Encounter: Payer: Self-pay | Admitting: Physical Therapy

## 2017-05-04 DIAGNOSIS — R293 Abnormal posture: Secondary | ICD-10-CM

## 2017-05-04 DIAGNOSIS — M542 Cervicalgia: Secondary | ICD-10-CM

## 2017-05-04 NOTE — Therapy (Signed)
[facility] [place], [place], [address] Phone: [phone]   Fax:  [phone]  Physical Therapy Treatment  Patient Details  Name: [place] Adams MRN: [id number] Date of Birth: 1946/04/26 Referring Provider: Tania Ade, MD  Encounter Date: 05/04/2017      PT End of Session - 05/04/17 1308    Visit Number 4   Number of Visits 12   Date for PT Re-Evaluation 06/06/17   PT Start Time 0900   PT Stop Time 0945   PT Time Calculation (min) 45 min   Activity Tolerance Patient tolerated treatment well   Behavior During Therapy [facility] for tasks assessed/performed      Past Medical History:  Diagnosis Date  . Allergic rhinitis   . Anxiety   . Cancer ([place])    lymphoma 2011  . Hot flashes     Past Surgical History:  Procedure Laterality Date  . ABDOMINAL HYSTERECTOMY    . BACK SURGERY    . LIMBAL STEM CELL TRANSPLANT      There were no vitals filed for this visit.      Subjective Assessment - 05/04/17 0908    Subjective Pt reporting she feels like traction helped after last session.    Pertinent History Previous right shoulder injury.   Diagnostic tests X-ry.   Patient Stated Goals Want to ger out of pain so I can do ADL's.   Currently in Pain? Yes   Pain Score 7    Pain Location Neck   Pain Descriptors / Indicators Tingling;Aching   Pain Onset More than a month ago   Pain Frequency Constant   Aggravating Factors  cervical rotation   Pain Relieving Factors rest            [facility] PT Assessment - 05/04/17 0001      Assessment   Medical Diagnosis Cervical radiculopathy.   Referring Provider Tania Ade, MD   Hand Dominance Right     Precautions   Precautions None     Restrictions   Weight Bearing Restrictions No     Balance Screen   Has the patient fallen in the past 6 months No   Has the patient had a decrease in activity level because of a fear of falling?  No   Is the patient reluctant  to leave their home because of a fear of falling?  No                     [facility] Adult PT Treatment/Exercise - 05/04/17 0001      Traction   Min (lbs) (P)  5   Max (lbs) (P)  20   Hold Time (P)  99   Rest Time (P)  15     Manual Therapy   Manual Therapy (P)  Soft tissue mobilization;Myofascial release;Passive ROM   Manual therapy comments (P)  Patient supine   Soft tissue mobilization (P)  manual STW to bil c-spine and right UT/levator    Myofascial Release (P)  trigger point release to supoccipitals and right UT    Passive ROM (P)  manual stretching and ROM for levator/UT /rotation and side bending movements gentle range                PT Education - 05/04/17 0912    Education Details HEP review   Person(s) Educated Patient   Methods Explanation;Demonstration   Comprehension Verbalized understanding;Returned demonstration          PT  Short Term Goals - 04/25/17 1330      PT SHORT TERM GOAL #1   Title STG's=LTG's.           PT Long Term Goals - 04/30/17 1152      PT LONG TERM GOAL #1   Title Independent with a HEP.   Time 6   Period Weeks   Status On-going     PT LONG TERM GOAL #2   Title Increase active cervical rotation to 70 degrees+ so patient can turn head more easily while driving.   Time 6   Period Weeks   Status On-going     PT LONG TERM GOAL #3   Title Eliminate right UE symptoms.   Time 6   Period Weeks   Status On-going     PT LONG TERM GOAL #4   Title Perform ADL's with pain not > 2/10.   Time 6   Period Weeks   Status On-going               Plan - 05/04/17 1309    Clinical Impression Statement Patient tolerated treatment well today. Pt tolerated cervical traction at 20# well and STW to R upper trap, cervical paraspinals and occipital region. Trigger point release performed on medial scapular border. Pt reporting pain of 1-2/10 at end of session. Continue with skilled PT.    Rehab Potential Good   PT  Frequency 2x / week   PT Duration 6 weeks   PT Treatment/Interventions ADLs/Self Care Home Management;Electrical Stimulation;Traction;Therapeutic activities;Therapeutic exercise;Patient/family education;Manual techniques;Passive range of motion;Dry needling   PT Next Visit Plan cont with POC for 20# cervical traction next treatment and manual STW, ROM and stretching to cervical spine in supine   Consulted and Agree with Plan of Care Patient      Patient will benefit from skilled therapeutic intervention in order to improve the following deficits and impairments:  Pain, Decreased activity tolerance, Decreased range of motion, Postural dysfunction  Visit Diagnosis: Abnormal posture  Cervicalgia     Problem List There are no active problems to display for this patient.   Oretha Caprice, MPT 05/04/2017, 1:13 PM  [facility] [place], [place], [address] Phone: [phone]   Fax:  [phone]  Name: [place] MRN: [id number] Date of Birth: 05-Nov-1946

## 2017-05-08 ENCOUNTER — Ambulatory Visit: Payer: Medicare Other | Admitting: Physical Therapy

## 2017-05-08 DIAGNOSIS — M542 Cervicalgia: Secondary | ICD-10-CM | POA: Diagnosis not present

## 2017-05-08 DIAGNOSIS — R293 Abnormal posture: Secondary | ICD-10-CM

## 2017-05-08 NOTE — Therapy (Signed)
[facility] [place], [place], [address] Phone: [phone]   Fax:  [phone]  Physical Therapy Treatment  Patient Details  Name: [place] MRN: [id number] Date of Birth: Dec 10, 1946 Referring Provider: Tania Ade, MD  Encounter Date: 05/08/2017      PT End of Session - 05/08/17 0902    Visit Number 5   Number of Visits 12   Date for PT Re-Evaluation 06/06/17   PT Start Time 0902   PT Stop Time 1011  delay with start of treatment due to PT with previous pt   PT Time Calculation (min) 69 min   Activity Tolerance Patient tolerated treatment well   Behavior During Therapy [facility] for tasks assessed/performed      Past Medical History:  Diagnosis Date  . Allergic rhinitis   . Anxiety   . Cancer ([place])    lymphoma 2011  . Hot flashes     Past Surgical History:  Procedure Laterality Date  . ABDOMINAL HYSTERECTOMY    . BACK SURGERY    . LIMBAL STEM CELL TRANSPLANT      There were no vitals filed for this visit.      Subjective Assessment - 05/08/17 0907    Subjective Patient reports she was doing well but then washed her car and the pain has returned.   Pertinent History Previous right shoulder injury.   Diagnostic tests X-ry.   Patient Stated Goals Want to ger out of pain so I can do ADL's.   Currently in Pain? Yes   Pain Score 7    Pain Location Neck   Pain Orientation Right   Pain Descriptors / Indicators Aching;Tingling   Pain Radiating Towards into right hand in digits 2-4 and top of hand   Pain Onset More than a month ago   Pain Frequency Constant   Aggravating Factors  cervical rotation, washing car   Pain Relieving Factors rest   Effect of Pain on Daily Activities limited                         [facility] Adult PT Treatment/Exercise - 05/08/17 0001      Exercises   Exercises Neck     Neck Exercises: Seated   Postural Training scapular retraction x 10     Modalities    Modalities Electrical Stimulation;Moist Heat     Moist Heat Therapy   Number Minutes Moist Heat 15 Minutes   Moist Heat Location Cervical;Shoulder     Electrical Stimulation   Electrical Stimulation Location R cervical spine and shoulder   Electrical Stimulation Action IFC   Electrical Stimulation Parameters 80-150 Hz x 15 min   Electrical Stimulation Goals Pain     Manual Therapy   Manual Therapy Soft tissue mobilization   Soft tissue mobilization to R UT, lev scap, R cspine; infraspinatus and deltoids   Passive ROM into cervical rotation     Neck Exercises: Stretches   Upper Trapezius Stretch 1 rep;30 seconds   Levator Stretch 1 rep;30 seconds   Neck Stretch 1 rep;20 seconds  chin to chest   Corner Stretch 1 rep;60 seconds   Corner Stretch Limitations in doorway          Trigger Point Dry Needling - 05/08/17 1600    Consent Given? Yes   Education Handout Provided Yes   Muscles Treated Upper Body Upper trapezius;Suboccipitals muscle group;Pectoralis major;Levator scapulae;Infraspinatus  R; and deltoids (M) and cervical mulifidi C2,3,  4, 6   Upper Trapezius Response Palpable increased muscle length;Twitch reponse elicited   SubOccipitals Response Twitch response elicited;Palpable increased muscle length   Pectoralis Major Response --  no twitch elicited   Levator Scapulae Response Twitch response elicited;Palpable increased muscle length   Infraspinatus Response Twitch response elicited;Palpable increased muscle length              PT Education - 05/08/17 1603    Education provided Yes   Education Details DN education and aftercare; HEP   Person(s) Educated Patient   Methods Explanation;Demonstration;Tactile cues;Verbal cues;Handout   Comprehension Verbalized understanding;Returned demonstration          PT Short Term Goals - 04/25/17 1330      PT SHORT TERM GOAL #1   Title STG's=LTG's.           PT Long Term Goals - 04/30/17 1152      PT  LONG TERM GOAL #1   Title Independent with a HEP.   Time 6   Period Weeks   Status On-going     PT LONG TERM GOAL #2   Title Increase active cervical rotation to 70 degrees+ so patient can turn head more easily while driving.   Time 6   Period Weeks   Status On-going     PT LONG TERM GOAL #3   Title Eliminate right UE symptoms.   Time 6   Period Weeks   Status On-going     PT LONG TERM GOAL #4   Title Perform ADL's with pain not > 2/10.   Time 6   Period Weeks   Status On-going               Plan - 05/08/17 1606    Clinical Impression Statement Patient did very well with TPDN today with report of decreased pain by 50% afterward. She had ++ twitch responses in cervical multifidi, UT and infraspinatus.    Rehab Potential Good   PT Frequency 2x / week   PT Duration 6 weeks   PT Treatment/Interventions ADLs/Self Care Home Management;Electrical Stimulation;Traction;Therapeutic activities;Therapeutic exercise;Patient/family education;Manual techniques;Passive range of motion;Dry needling   PT Next Visit Plan Assess DN; continue as indicated; manual STW, ROM and stretching to cervical spine in supine   PT Home Exercise Plan UT, Lev Scap, pec stretch, scap retraction   Consulted and Agree with Plan of Care Patient      Patient will benefit from skilled therapeutic intervention in order to improve the following deficits and impairments:  Pain, Decreased activity tolerance, Decreased range of motion, Postural dysfunction  Visit Diagnosis: Cervicalgia  Abnormal posture     Problem List There are no active problems to display for this patient.   Madelyn Flavors PT 05/08/2017, [facility] [address] [place], [place], [address] Phone: [phone]   Fax:  [phone]  Name: [place] MRN: [id number] Date of Birth: [birth year]

## 2017-05-08 NOTE — Patient Instructions (Addendum)
Trigger Point Dry Needling  . What is Trigger Point Dry Needling (DN)? o DN is a physical therapy technique used to treat muscle pain and dysfunction. Specifically, DN helps deactivate muscle trigger points (muscle knots).  o A thin filiform needle is used to penetrate the skin and stimulate the underlying trigger point. The goal is for a local twitch response (LTR) to occur and for the trigger point to relax. No medication of any kind is injected during the procedure.   . What Does Trigger Point Dry Needling Feel Like?  o The procedure feels different for each individual patient. Some patients report that they do not actually feel the needle enter the skin and overall the process is not painful. Very mild bleeding may occur. However, many patients feel a deep cramping in the muscle in which the needle was inserted. This is the local twitch response.   Marland Kitchen How Will I feel after the treatment? o Soreness is normal, and the onset of soreness may not occur for a few hours. Typically this soreness does not last longer than two days.  o Bruising is uncommon, however; ice can be used to decrease any possible bruising.  o In rare cases feeling tired or nauseous after the treatment is normal. In addition, your symptoms may get worse before they get better, this period will typically not last longer than 24 hours.   . What Can I do After My Treatment? o Increase your hydration by drinking more water for the next 24 hours. o You may place ice or heat on the areas treated that have become sore, however, do not use heat on inflamed or bruised areas. Heat often brings more relief post needling. o You can continue your regular activities, but vigorous activity is not recommended initially after the treatment for 24 hours. o DN is best combined with other physical therapy such as strengthening, stretching, and other therapies.    Precautions:  In some cases, dry needling is done over the lung field. While rare,  there is a risk of pneumothorax (punctured lung). Because of this, if you ever experience shortness of breath on exertion, difficulty taking a deep breath, chest pain or a dry cough following dry needling, you should report to an emergency room and tell them that you have been dry needled over the thorax.   Flexibility: Upper Trapezius Stretch   Gently grasp right side of head while reaching behind back with other hand. Tilt head away until a gentle stretch is felt. Hold 30 seconds. Repeat 3 times per set. Do 2 sessions per day.  http://orth.exer.us/340   Levator Stretch   Grasp seat or sit on hand on side to be stretched. Turn head toward other side and look down. Use hand on head to gently stretch neck in that position. Hold _30___ seconds. Repeat on other side. Repeat 3 times. Do 2 sessions per day.  http://[email or web address].exer.us/30   Scapular Retraction (Standing)   With arms at sides, pinch shoulder blades together. Repeat 10 times per set. Do 1-3 sets per session. Do 2 sessions per day.  http://orth.exer.us/944   Posture - Sitting   Sit upright, head facing forward. Try using a roll to support lower back. Keep shoulders relaxed, and avoid rounded back. Keep hips level with knees. Avoid crossing legs for long periods.   Flexibility: Corner Stretch   Standing in corner or a doorway with hands just above shoulder level.  Lean forward until a comfortable stretch is felt across chest. Hold __30__  seconds. Repeat __3__ times per set.  Do _2___ sessions per day.  http://orth.exer.us/342   Copyright  VHI. All rights reserved.   Madelyn Flavors, PT 05/08/17 9:48 AM [facility] [address] [place], [place], [address] Phone: [phone]   Fax:  [phone]

## 2017-05-10 ENCOUNTER — Encounter: Payer: Self-pay | Admitting: Physical Therapy

## 2017-05-10 ENCOUNTER — Ambulatory Visit: Payer: Medicare Other | Admitting: Physical Therapy

## 2017-05-10 DIAGNOSIS — M542 Cervicalgia: Secondary | ICD-10-CM | POA: Diagnosis not present

## 2017-05-10 DIAGNOSIS — R293 Abnormal posture: Secondary | ICD-10-CM

## 2017-05-10 NOTE — Therapy (Signed)
[facility] [place], [place], [address] Phone: [phone]   Fax:  [phone]  Physical Therapy Treatment  Patient Details  Name: [place] MRN: [id number] Date of Birth: 1946/03/27 Referring Provider: Tania Ade, MD  Encounter Date: 05/10/2017      PT End of Session - 05/10/17 1020    Visit Number 6   Number of Visits 12   Date for PT Re-Evaluation 06/06/17   PT Start Time 0947   PT Stop Time 1045   PT Time Calculation (min) 58 min   Activity Tolerance Patient tolerated treatment well   Behavior During Therapy [facility] for tasks assessed/performed      Past Medical History:  Diagnosis Date  . Allergic rhinitis   . Anxiety   . Cancer ([place])    lymphoma 2011  . Hot flashes     Past Surgical History:  Procedure Laterality Date  . ABDOMINAL HYSTERECTOMY    . BACK SURGERY    . LIMBAL STEM CELL TRANSPLANT      There were no vitals filed for this visit.      Subjective Assessment - 05/10/17 1000    Subjective Patient felt traction has really helped and did "ok" after DN session, still sore after washing car   Pertinent History Previous right shoulder injury.   Diagnostic tests X-ry.   Patient Stated Goals Want to ger out of pain so I can do ADL's.   Currently in Pain? Yes   Pain Score 9    Pain Location Neck   Pain Orientation Right   Pain Descriptors / Indicators Aching   Pain Type Acute pain   Pain Onset More than a month ago   Pain Frequency Intermittent   Aggravating Factors  increased movement or activity   Pain Relieving Factors at rest/traction                         [facility] Adult PT Treatment/Exercise - 05/10/17 0001      Neck Exercises: Seated   Cervical Isometrics Flexion;Extension;Right lateral flexion;Left lateral flexion;5 secs;5 reps   Neck Retraction 10 reps   Postural Training scapular retraction x 10     Moist Heat Therapy   Number Minutes Moist Heat 10  Minutes   Moist Heat Location Cervical;Shoulder     Electrical Stimulation   Electrical Stimulation Location R cervical spine and shoulder   Electrical Stimulation Action IFC   Electrical Stimulation Parameters 80-150hz  x72min   Electrical Stimulation Goals Pain     Traction   Type of Traction Cervical   Min (lbs) 5   Max (lbs) 20   Hold Time 99   Rest Time 15     Manual Therapy   Manual Therapy Soft tissue mobilization   Passive ROM cervical paraspinals and UT/levator                PT Education - 05/10/17 1038    Education provided Yes   Education Details HEP   Person(s) Educated Patient   Methods Explanation;Demonstration;Handout   Comprehension Verbalized understanding;Returned demonstration          PT Short Term Goals - 04/25/17 1330      PT SHORT TERM GOAL #1   Title STG's=LTG's.           PT Long Term Goals - 04/30/17 1152      PT LONG TERM GOAL #1   Title Independent with a HEP.   Time  6   Period Weeks   Status On-going     PT LONG TERM GOAL #2   Title Increase active cervical rotation to 70 degrees+ so patient can turn head more easily while driving.   Time 6   Period Weeks   Status On-going     PT LONG TERM GOAL #3   Title Eliminate right UE symptoms.   Time 6   Period Weeks   Status On-going     PT LONG TERM GOAL #4   Title Perform ADL's with pain not > 2/10.   Time 6   Period Weeks   Status On-going               Plan - 05/10/17 1026    Clinical Impression Statement Patient tolerated treatment well today. Patient reported improvement after cervical traction. Patient able to start cervical isometrics today with no pain or difficulty. Patient has increased pain with ADL's. Patient has reported relief after traction. Goals progressing yet ongoing due to pain deficts.    Rehab Potential Good   PT Frequency 2x / week   PT Duration 6 weeks   PT Treatment/Interventions ADLs/Self Care Home Management;Electrical  Stimulation;Traction;Therapeutic activities;Therapeutic exercise;Patient/family education;Manual techniques;Passive range of motion;Dry needling   PT Next Visit Plan cont with POC for cervical traction per MPT   Consulted and Agree with Plan of Care Patient      Patient will benefit from skilled therapeutic intervention in order to improve the following deficits and impairments:  Pain, Decreased activity tolerance, Decreased range of motion, Postural dysfunction  Visit Diagnosis: Cervicalgia  Abnormal posture     Problem List There are no active problems to display for this patient.   Phillips Climes, PTA 05/10/2017, 10:52 AM  [facility] [place], [place], [address] Phone: [phone]   Fax:  [phone]  Name: [place] Adams MRN: [id number] Date of Birth: 09-11-1946

## 2017-05-10 NOTE — Patient Instructions (Signed)
Strengthening: Lateral Bend - Isometric (in Neutral)    Using light pressure from fingertips, press into right then left temple. Resist bending head sideways. Hold __10__ seconds. Repeat _5___ times per set. Do _2___ sets per session. Do _2___ sessions per day.   Strengthening: Flexion - Isometric (in Neutral)    Using light pressure from fingertips at forehead, resist bending head forward. Hold __10__ seconds. Repeat __5__ times per set. Do __2__ sets per session. Do _2___ sessions per day.   Strengthening: Extension - Isometric (in Neutral)    Using light pressure from fingertips at back of head, resist bending head backward. Hold _10___ seconds. Repeat __5__ times per set. Do _2___ sets per session. Do __2__ sessions per day.      

## 2017-05-15 ENCOUNTER — Ambulatory Visit: Payer: Medicare Other | Attending: Orthopedic Surgery | Admitting: Physical Therapy

## 2017-05-15 DIAGNOSIS — M542 Cervicalgia: Secondary | ICD-10-CM

## 2017-05-15 NOTE — Therapy (Addendum)
[facility] [place], [place], [address] Phone: [phone]   Fax:  [phone]  Physical Therapy Treatment  Patient Details  Name: Audrey Adams MRN: [id number] Date of Birth: [birth year] Referring Provider: Tania Ade, MD  Encounter Date: 05/15/2017      PT End of Session - 05/15/17 0906    Visit Number 7   Number of Visits 12   Date for PT Re-Evaluation 06/06/17   PT Start Time 0905   PT Stop Time 1003   PT Time Calculation (min) 58 min   Activity Tolerance Patient tolerated treatment well   Behavior During Therapy [facility] for tasks assessed/performed      Past Medical History:  Diagnosis Date  . Allergic rhinitis   . Anxiety   . Cancer ([place])    lymphoma 2011  . Hot flashes     Past Surgical History:  Procedure Laterality Date  . ABDOMINAL HYSTERECTOMY    . BACK SURGERY    . LIMBAL STEM CELL TRANSPLANT      There were no vitals filed for this visit.      Subjective Assessment - 05/15/17 0906    Subjective Patient reports no improvement overall. She had initial improvement with traction, but after flaring it up washing her car it has been no better.   Diagnostic tests X-ry.   Patient Stated Goals Want to ger out of pain so I can do ADL's.   Currently in Pain? Yes   Pain Score 8    Pain Location Neck   Pain Orientation Right   Pain Descriptors / Indicators Aching   Pain Radiating Towards across back of R shoulder down arm and into dorsum of hand with tingling in digits 2-4   Pain Onset More than a month ago   Pain Frequency Intermittent   Aggravating Factors  increased movement or activity   Pain Relieving Factors rest   Effect of Pain on Daily Activities limited            [facility] PT Assessment - 05/15/17 0001      ROM / Strength   AROM / PROM / Strength AROM     AROM   AROM Assessment Site Cervical   Cervical Flexion 23  increases UE sx   Cervical Extension 50   Cervical - Right  Side Bend 28   Cervical - Left Side Bend 28   Cervical - Right Rotation 30   Cervical - Left Rotation WFL left     Special Tests    Special Tests Cervical   Cervical Tests Spurling's;Dictraction;other     Spurling's   Findings Positive   Side Right   Comment more for pain than N/T     Distraction Test   Findngs Negative     other    Comment cervical retraction with ext decreases pain from 7/10 to 3/10                     [facility] Adult PT Treatment/Exercise - 05/15/17 0001      Neck Exercises: Seated   Other Seated Exercise cervical retraction with extension multiple reps with 30 sec hold; varying angles of ext     Neck Exercises: Prone   Neck Retraction 10 reps;5 secs  2 sets   Neck Retraction Limitations increases sx into RUE (pain)   Other Prone Exercise cervical diagonals POE x 10 each way  increases pain into RUE     Traction   Type  of Traction Cervical   Min (lbs) 5   Max (lbs) 20   Hold Time 99   Rest Time 15                  PT Short Term Goals - 04/25/17 1330      PT SHORT TERM GOAL #1   Title STG's=LTG's.           PT Long Term Goals - 05/15/17 0957      PT LONG TERM GOAL #1   Title Independent with a HEP.   Time 6   Period Weeks   Status On-going     PT LONG TERM GOAL #2   Title Increase active cervical rotation to 70 degrees+ so patient can turn head more easily while driving.   Time 6   Period Weeks   Status On-going     PT LONG TERM GOAL #3   Title Eliminate right UE symptoms.   Time 6   Period Weeks   Status On-going     PT LONG TERM GOAL #4   Title Perform ADL's with pain not > 2/10.   Time 6   Period Weeks   Status On-going               Plan - 05/15/17 2482    Clinical Impression Statement Patient presents today with reports of no overall improvement in neck and RUE pain/sx. She has pain to hand with N/T into digits 2-4 intermittently. She is able to abolish RUE pain and NT with cervical  retraction and extension and in general does not have pain with extension. Her ROM is limited primarily in flexion and R rotation, however R rotation improved to equal left after cervical diagonals in POE was performed suggesting weak stabilizers.   Rehab Potential Good   PT Frequency 2x / week   PT Duration 6 weeks   PT Treatment/Interventions ADLs/Self Care Home Management;Electrical Stimulation;Traction;Therapeutic activities;Therapeutic exercise;Patient/family education;Manual techniques;Passive range of motion;Dry needling   PT Next Visit Plan Continue cervical stabilization, traction if beneficial and modalities prn.   PT Home Exercise Plan UT, Lev Scap, pec stretch, scap retraction   Consulted and Agree with Plan of Care Patient      Patient will benefit from skilled therapeutic intervention in order to improve the following deficits and impairments:  Pain, Decreased activity tolerance, Decreased range of motion, Postural dysfunction  Visit Diagnosis: Cervicalgia     Problem List There are no active problems to display for this patient.   Almyra Free Bowen Kia PT 05/15/2017, 10:04 AM  [facility] [address] [place], [place], [address] Phone: [phone]   Fax:  [phone]  Name: Audrey Adams MRN: [id number] Date of Birth: November 05, 1946  PHYSICAL THERAPY DISCHARGE SUMMARY  Visits from Start of Care: 7.  Current functional level related to goals / functional outcomes: See above.   Remaining deficits: No goals met.   Education / Equipment: HEP. Plan: Patient agrees to discharge.  Patient goals were not met. Patient is being discharged due to lack of progress.  ?????         Mali Applegate MPT

## 2017-05-15 NOTE — Patient Instructions (Signed)
   Madelyn Flavors, PT 05/15/17 9:49 AM [facility] [address] [place], [place], [address] Phone: [phone]   Fax:  [phone]

## 2017-05-17 ENCOUNTER — Encounter: Payer: Medicare Other | Admitting: Physical Therapy

## 2017-05-28 ENCOUNTER — Other Ambulatory Visit: Payer: Self-pay | Admitting: Orthopedic Surgery

## 2017-05-29 ENCOUNTER — Other Ambulatory Visit: Payer: Self-pay | Admitting: Orthopedic Surgery

## 2017-06-04 ENCOUNTER — Encounter (HOSPITAL_COMMUNITY)
Admission: RE | Admit: 2017-06-04 | Discharge: 2017-06-04 | Disposition: A | Discharge status: Home / Self Care | Payer: Medicare Other | Source: Ambulatory Visit | Attending: Orthopedic Surgery | Admitting: Orthopedic Surgery

## 2017-06-04 ENCOUNTER — Ambulatory Visit (HOSPITAL_COMMUNITY)
Admission: RE | Admit: 2017-06-04 | Discharge: 2017-06-04 | Disposition: A | Discharge status: Home / Self Care | Payer: Medicare Other | Source: Ambulatory Visit | Attending: Orthopedic Surgery | Admitting: Orthopedic Surgery

## 2017-06-04 ENCOUNTER — Encounter (HOSPITAL_COMMUNITY): Payer: Self-pay

## 2017-06-04 DIAGNOSIS — Z01812 Encounter for preprocedural laboratory examination: Secondary | ICD-10-CM | POA: Diagnosis not present

## 2017-06-04 DIAGNOSIS — Z01818 Encounter for other preprocedural examination: Secondary | ICD-10-CM | POA: Diagnosis not present

## 2017-06-04 DIAGNOSIS — I700 Atherosclerosis of aorta: Secondary | ICD-10-CM | POA: Diagnosis not present

## 2017-06-04 DIAGNOSIS — D696 Thrombocytopenia, unspecified: Secondary | ICD-10-CM | POA: Diagnosis not present

## 2017-06-04 DIAGNOSIS — M79601 Pain in right arm: Secondary | ICD-10-CM | POA: Insufficient documentation

## 2017-06-04 DIAGNOSIS — Z9484 Stem cells transplant status: Secondary | ICD-10-CM | POA: Diagnosis not present

## 2017-06-04 DIAGNOSIS — M79602 Pain in left arm: Secondary | ICD-10-CM | POA: Insufficient documentation

## 2017-06-04 DIAGNOSIS — R9431 Abnormal electrocardiogram [ECG] [EKG]: Secondary | ICD-10-CM | POA: Insufficient documentation

## 2017-06-04 DIAGNOSIS — Z8572 Personal history of non-Hodgkin lymphomas: Secondary | ICD-10-CM | POA: Diagnosis not present

## 2017-06-04 HISTORY — DX: Thrombocytopenia, unspecified: D69.6

## 2017-06-04 HISTORY — DX: Other specified postprocedural states: Z98.890

## 2017-06-04 HISTORY — DX: Adverse effect of unspecified anesthetic, initial encounter: T41.45XA

## 2017-06-04 HISTORY — DX: Other complications of anesthesia, initial encounter: T88.59XA

## 2017-06-04 HISTORY — DX: Other specified postprocedural states: R11.2

## 2017-06-04 LAB — URINALYSIS, ROUTINE W REFLEX MICROSCOPIC
BACTERIA UA: NONE SEEN
BILIRUBIN URINE: NEGATIVE
Glucose, UA: NEGATIVE mg/dL
Hgb urine dipstick: NEGATIVE
KETONES UR: NEGATIVE mg/dL
Nitrite: NEGATIVE
PH: 6 (ref 5.0–8.0)
Protein, ur: NEGATIVE mg/dL
Specific Gravity, Urine: 1.015 (ref 1.005–1.030)

## 2017-06-04 LAB — CBC WITH DIFFERENTIAL/PLATELET
BASOS ABS: 0 10*3/uL (ref 0.0–0.1)
BASOS PCT: 0 %
EOS ABS: 0.1 10*3/uL (ref 0.0–0.7)
Eosinophils Relative: 2 %
HEMATOCRIT: 40.3 % (ref 36.0–46.0)
HEMOGLOBIN: 13.2 g/dL (ref 12.0–15.0)
Lymphocytes Relative: 36 %
Lymphs Abs: 1.3 10*3/uL (ref 0.7–4.0)
MCH: 32.8 pg (ref 26.0–34.0)
MCHC: 32.8 g/dL (ref 30.0–36.0)
MCV: 100 fL (ref 78.0–100.0)
MONOS PCT: 7 %
Monocytes Absolute: 0.2 10*3/uL (ref 0.1–1.0)
NEUTROS PCT: 55 %
Neutro Abs: 1.9 10*3/uL (ref 1.7–7.7)
Platelets: 91 10*3/uL — ABNORMAL LOW (ref 150–400)
RBC: 4.03 MIL/uL (ref 3.87–5.11)
RDW: 13.3 % (ref 11.5–15.5)
WBC: 3.5 10*3/uL — ABNORMAL LOW (ref 4.0–10.5)

## 2017-06-04 LAB — COMPREHENSIVE METABOLIC PANEL
ALBUMIN: 4.3 g/dL (ref 3.5–5.0)
ALK PHOS: 55 U/L (ref 38–126)
ALT: 16 U/L (ref 14–54)
AST: 17 U/L (ref 15–41)
Anion gap: 7 (ref 5–15)
BUN: 18 mg/dL (ref 6–20)
CALCIUM: 9.1 mg/dL (ref 8.9–10.3)
CO2: 26 mmol/L (ref 22–32)
CREATININE: 0.71 mg/dL (ref 0.44–1.00)
Chloride: 107 mmol/L (ref 101–111)
GFR calc Af Amer: >60 mL/min (ref >60)
GFR calc non Af Amer: >60 mL/min (ref >60)
Glucose, Bld: 111 mg/dL — ABNORMAL HIGH (ref 65–99)
Potassium: 3.7 mmol/L (ref 3.5–5.1)
Sodium: 140 mmol/L (ref 135–145)
Total Bilirubin: 0.7 mg/dL (ref 0.3–1.2)
Total Protein: 6.6 g/dL (ref 6.5–8.1)

## 2017-06-04 LAB — PROTIME-INR
INR: 0.96
Prothrombin Time: 12.8 seconds (ref 11.4–15.2)

## 2017-06-04 LAB — SURGICAL PCR SCREEN
MRSA, PCR: NEGATIVE
STAPHYLOCOCCUS AUREUS: NEGATIVE

## 2017-06-04 LAB — APTT: APTT: 31 s (ref 24–36)

## 2017-06-04 NOTE — Pre-Procedure Instructions (Signed)
Audrey Adams  06/04/2017     Your procedure is scheduled on Wednesday, June 27  Report to [facility] Admitting at 10:15 A.M.               Your surgery or procedure is scheduled for 1:15 PM   Call this number if you have problems the morning of surgery: [phone]    Remember:  Do not eat food or drink liquids after midnight Tuesday, June 26  Take these medicines the morning of surgery with A SIP OF WATER: May take if needed: acetaminophen (TYLENOL) or  traMADol (ULTRAM). May use azelastine (ASTELIN)  Nasal spray.          STOP taking Aspirin, Aspirin Products (Goody Powder, Excedrin Migraine), Ibuprofen (Advil), Naproxen (Aleve), Vitamins and Herbal Products (ie Fish Oil).            Special Instructions:  [place]- Preparing For Surgery  Before surgery, you can play an important role. Because skin is not sterile, your skin needs to be as free of germs as possible. You can reduce the number of germs on your skin by washing with CHG (chlorahexidine gluconate) Soap before surgery.  CHG is an antiseptic cleaner which kills germs and bonds with the skin to continue killing germs even after washing.  Please do not use if you have an allergy to CHG or antibacterial soaps. If your skin becomes reddened/irritated stop using the CHG.  Do not shave (including legs and underarms) for at least 48 hours prior to first CHG shower. It is OK to shave your face.  Please follow these instructions carefully.   1. Shower the NIGHT BEFORE SURGERY and the MORNING OF SURGERY with CHG.   2. If you chose to wash your hair, wash your hair first as usual with your normal shampoo.  3. After you shampoo, rinse your hair and body thoroughly to remove the shampoo.  4. Use CHG as you would any other liquid soap. You can apply CHG directly to the skin and wash gently with a scrungie or a clean washcloth.   5. Apply the CHG Soap to your body ONLY FROM THE NECK DOWN.  Do not use on open wounds  or open sores. Avoid contact with your eyes, ears, mouth and genitals (private parts). Wash genitals (private parts) with your normal soap.  6. Wash thoroughly, paying special attention to the area where your surgery will be performed.  7. Thoroughly rinse your body with warm water from the neck down.  8. DO NOT shower/wash with your normal soap after using and rinsing off the CHG Soap.  9. Pat yourself dry with a CLEAN TOWEL.   10. Wear CLEAN PAJAMAS   11. Place CLEAN SHEETS on your bed the night of your first shower and DO NOT SLEEP WITH PETS.  Day of Surgery: Do not apply any deodorants/lotions, powders, or perfumes, . Please wear clean clothes to the hospital/surgery center.    Do not wear jewelry, make-up or nail polish.  Do not wear lotions, powders, or perfumes, or deoderant.  Do not shave 48 hours prior to surgery.  Men may shave face and neck.  Do not bring valuables to the hospital.  [facility] is not responsible for any belongings or valuables.  Contacts, dentures or bridgework may not be worn into surgery.  Leave your suitcase in the car.  After surgery it may be brought to your room.  For patients admitted to the hospital,  discharge time will be determined by your treatment team.  Patients discharged the day of surgery will not be allowed to drive home.   Name and phone number of your driver:   -  Please read over the following fact sheets that you were given: Pain Booklet, Coughing and Deep Breathing, Surgical Site Infections.

## 2017-06-04 NOTE — H&P (Signed)
     PREOPERATIVE H&P  Chief Complaint: Right arm pain  HPI: Audrey Adams is a 71 y.o. female who presents with ongoing pain in the right arm  MRI reveals severe right-sided neuroforaminal stenosis is noted at C6-C7.    Patient has failed multiple forms of conservative care and continues to have pain (see office notes for additional details regarding the patient's full course of treatment)  Past Medical History:  Diagnosis Date  . Allergic rhinitis   . Anxiety   . Cancer [facility])    lymphoma tx with stem cell transplant 2011  . Complication of anesthesia   . Hot flashes   . PONV (postoperative nausea and vomiting)   . Thrombocytopenia ([place])    Past Surgical History:  Procedure Laterality Date  . ABDOMINAL HYSTERECTOMY    . BACK SURGERY    . LIMBAL STEM CELL TRANSPLANT    . PLANTAR FASCIA SURGERY Bilateral    10 years ago   Social History   Social History  . Marital status: Married    Spouse name: N/A  . Number of children: N/A  . Years of education: N/A   Social History Main Topics  . Smoking status: Never Smoker  . Smokeless tobacco: Not on file  . Alcohol use No  . Drug use: No  . Sexual activity: Not on file   Other Topics Concern  . Not on file   Social History Narrative  . No narrative on file   No family history on file. Allergies  Allergen Reactions  . Allopurinol Diarrhea  . Codeine Other (See Comments)    headache  . Paroxetine Other (See Comments)    unknown  . Percocet [Oxycodone-Acetaminophen] Other (See Comments)    headache  . Bactrim [Sulfamethoxazole-Trimethoprim] Rash  . Ciprofloxacin Rash  . Tegretol [Carbamazepine] Rash   Prior to Admission medications   Medication Sig Start Date End Date Taking? Authorizing Provider  acetaminophen (TYLENOL) 500 MG tablet Take 1,000 mg by mouth every 6 (six) hours as needed for mild pain.   Yes [provider]  azelastine (ASTELIN) 0.1 % nasal spray Place 1 spray into both nostrils  daily as needed for rhinitis or allergies. Use in each nostril as directed   Yes [provider]  traMADol (ULTRAM) 50 MG tablet Take 50 mg by mouth every 6 (six) hours as needed for moderate pain.   Yes [provider]  gabapentin (NEURONTIN) 300 MG capsule Take 300 mg by mouth at bedtime.    [provider]  ibuprofen (ADVIL,MOTRIN) 200 MG tablet Take 400 mg by mouth every 6 (six) hours as needed for mild pain.    [provider]     All other systems have been reviewed and were otherwise negative with the exception of those mentioned in the HPI and as above.  Physical Exam: There were no vitals filed for this visit.  General: Alert, no acute distress Cardiovascular: No pedal edema Respiratory: No cyanosis, no use of accessory musculature Skin: No lesions in the area of chief complaint Neurologic: Sensation intact distally Psychiatric: Patient is competent for consent with normal mood and affect Lymphatic: No axillary or cervical lymphadenopathy  MUSCULOSKELETAL: + spurling sign on the right  Assessment/Plan: Right arm pain Plan for Procedure(s): ANTERIOR CERVICAL DECOMPRESSION FUSION, CERVICAL 6-7 WITH INSTRUMENTATION AND ALLOGRAFT   Sinclair Ship, MD 06/04/2017 4:36 PM

## 2017-06-04 NOTE — Progress Notes (Signed)
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 Never smoker. BMI 24  Medications reviewed.   Preoperative labs reviewed.  Platelets 91. This is consistent with prior results (in care everywhere)  CXR 06/04/17: There is no acute cardiopulmonary disease. Thoracic aortic atherosclerosis  EKG 06/04/17: Sinus bradycardia (51 bpm). Nonspecific T wave abnormality  I spoke with Zaia Carre Nevin from Dr. Laurena Bering office.  She will reach out to oncologist for perioperative guidance regarding pt's thrombocytopenia.   If no changes, I anticipate pt can proceed with surgery as scheduled.   Willeen Cass, FNP-BC [facility] Short Stay Surgical Center/Anesthesiology Phone: [phone] 06/04/2017 1:41 PM

## 2017-06-04 NOTE — Progress Notes (Signed)
PCP: Dr. Dione Housekeeper  Cardiologist: pt denies  EKG: pt denies past year  Stress test: 20 + years ago  ECHO: pt denies ever  Cardiac Cath: pt denies ever  Chest x-ray_:pt denies past year

## 2017-06-06 ENCOUNTER — Ambulatory Visit (HOSPITAL_COMMUNITY): Payer: Medicare Other | Admitting: Anesthesiology

## 2017-06-06 ENCOUNTER — Ambulatory Visit (HOSPITAL_COMMUNITY): Payer: Medicare Other | Admitting: Emergency Medicine

## 2017-06-06 ENCOUNTER — Ambulatory Visit (HOSPITAL_COMMUNITY)
Admission: AD | Disposition: A | Discharge status: Home / Self Care | Payer: Self-pay | Source: Ambulatory Visit | Attending: Orthopedic Surgery

## 2017-06-06 ENCOUNTER — Encounter (HOSPITAL_COMMUNITY): Payer: Self-pay | Admitting: Urology

## 2017-06-06 ENCOUNTER — Ambulatory Visit (HOSPITAL_COMMUNITY): Payer: Medicare Other

## 2017-06-06 ENCOUNTER — Ambulatory Visit (HOSPITAL_COMMUNITY)
Admission: AD | Admit: 2017-06-06 | Discharge: 2017-06-07 | Disposition: A | Discharge status: Home / Self Care | Payer: Medicare Other | Source: Ambulatory Visit | Attending: Orthopedic Surgery | Admitting: Orthopedic Surgery

## 2017-06-06 DIAGNOSIS — Z885 Allergy status to narcotic agent status: Secondary | ICD-10-CM | POA: Insufficient documentation

## 2017-06-06 DIAGNOSIS — Z8572 Personal history of non-Hodgkin lymphomas: Secondary | ICD-10-CM | POA: Diagnosis not present

## 2017-06-06 DIAGNOSIS — M5412 Radiculopathy, cervical region: Secondary | ICD-10-CM | POA: Diagnosis not present

## 2017-06-06 DIAGNOSIS — Z888 Allergy status to other drugs, medicaments and biological substances status: Secondary | ICD-10-CM | POA: Insufficient documentation

## 2017-06-06 DIAGNOSIS — Z881 Allergy status to other antibiotic agents status: Secondary | ICD-10-CM | POA: Diagnosis not present

## 2017-06-06 DIAGNOSIS — Z79899 Other long term (current) drug therapy: Secondary | ICD-10-CM | POA: Diagnosis not present

## 2017-06-06 DIAGNOSIS — Z9484 Stem cells transplant status: Secondary | ICD-10-CM | POA: Insufficient documentation

## 2017-06-06 DIAGNOSIS — M4802 Spinal stenosis, cervical region: Secondary | ICD-10-CM | POA: Diagnosis not present

## 2017-06-06 DIAGNOSIS — J309 Allergic rhinitis, unspecified: Secondary | ICD-10-CM | POA: Insufficient documentation

## 2017-06-06 DIAGNOSIS — M5410 Radiculopathy, site unspecified: Secondary | ICD-10-CM | POA: Diagnosis present

## 2017-06-06 DIAGNOSIS — Z419 Encounter for procedure for purposes other than remedying health state, unspecified: Secondary | ICD-10-CM

## 2017-06-06 HISTORY — PX: ANTERIOR CERVICAL DECOMP/DISCECTOMY FUSION: SHX1161

## 2017-06-06 SURGERY — ANTERIOR CERVICAL DECOMPRESSION/DISCECTOMY FUSION 1 LEVEL
Anesthesia: General | Laterality: Right

## 2017-06-06 MED ORDER — ONDANSETRON HCL 4 MG/2ML IJ SOLN
INTRAMUSCULAR | Status: AC
  Filled 2017-06-06: qty 2

## 2017-06-06 MED ORDER — SODIUM CHLORIDE 0.9 % IV SOLN: 250.0000 mL | INTRAVENOUS | Status: DC

## 2017-06-06 MED ORDER — MORPHINE SULFATE (PF) 4 MG/ML IV SOLN: 1.0000 mg | INTRAVENOUS | Status: DC | PRN

## 2017-06-06 MED ORDER — POTASSIUM CHLORIDE IN NACL 20-0.9 MEQ/L-% IV SOLN: INTRAVENOUS | Status: DC

## 2017-06-06 MED ORDER — CEFAZOLIN SODIUM-DEXTROSE 2-4 GM/100ML-% IV SOLN
2.0000 g | INTRAVENOUS | Status: AC
  Administered 2017-06-06: 2 g via INTRAVENOUS

## 2017-06-06 MED ORDER — PHENYLEPHRINE HCL 10 MG/ML IJ SOLN
INTRAVENOUS | Status: DC | PRN
  Administered 2017-06-06: 25 ug/min via INTRAVENOUS

## 2017-06-06 MED ORDER — FLEET ENEMA 7-19 GM/118ML RE ENEM: 1.0000 | ENEMA | Freq: Once | RECTAL | Status: DC | PRN

## 2017-06-06 MED ORDER — MIDAZOLAM HCL 5 MG/5ML IJ SOLN
INTRAMUSCULAR | Status: DC | PRN
  Administered 2017-06-06 (×2): 1 mg via INTRAVENOUS

## 2017-06-06 MED ORDER — POVIDONE-IODINE 7.5 % EX SOLN: Freq: Once | CUTANEOUS | Status: DC

## 2017-06-06 MED ORDER — MIDAZOLAM HCL 2 MG/2ML IJ SOLN
INTRAMUSCULAR | Status: AC
  Filled 2017-06-06: qty 2

## 2017-06-06 MED ORDER — PROPOFOL 10 MG/ML IV BOLUS
INTRAVENOUS | Status: DC | PRN
  Administered 2017-06-06: 120 mg via INTRAVENOUS

## 2017-06-06 MED ORDER — LACTATED RINGERS IV SOLN
INTRAVENOUS | Status: DC
  Administered 2017-06-06 (×2): via INTRAVENOUS

## 2017-06-06 MED ORDER — DOCUSATE SODIUM 100 MG PO CAPS
100.0000 mg | ORAL_CAPSULE | Freq: Two times a day (BID) | ORAL | Status: DC
  Administered 2017-06-06: 100 mg via ORAL
  Filled 2017-06-06: qty 1

## 2017-06-06 MED ORDER — ROCURONIUM BROMIDE 10 MG/ML (PF) SYRINGE
PREFILLED_SYRINGE | INTRAVENOUS | Status: AC
  Filled 2017-06-06: qty 5

## 2017-06-06 MED ORDER — MENTHOL 3 MG MT LOZG: 1.0000 | LOZENGE | OROMUCOSAL | Status: DC | PRN

## 2017-06-06 MED ORDER — SODIUM CHLORIDE 0.9% FLUSH: 3.0000 mL | INTRAVENOUS | Status: DC | PRN

## 2017-06-06 MED ORDER — HYDROMORPHONE HCL 1 MG/ML IJ SOLN
INTRAMUSCULAR | Status: AC
  Filled 2017-06-06: qty 0.5

## 2017-06-06 MED ORDER — HYDROMORPHONE HCL 1 MG/ML IJ SOLN
0.2500 mg | INTRAMUSCULAR | Status: DC | PRN
  Administered 2017-06-06 (×2): 0.25 mg via INTRAVENOUS
  Administered 2017-06-06: 0.5 mg via INTRAVENOUS

## 2017-06-06 MED ORDER — LIDOCAINE HCL (CARDIAC) 20 MG/ML IV SOLN
INTRAVENOUS | Status: DC | PRN
  Administered 2017-06-06: 60 mg via INTRAVENOUS

## 2017-06-06 MED ORDER — FENTANYL CITRATE (PF) 100 MCG/2ML IJ SOLN
INTRAMUSCULAR | Status: DC | PRN
  Administered 2017-06-06: 100 ug via INTRAVENOUS
  Administered 2017-06-06: 50 ug via INTRAVENOUS

## 2017-06-06 MED ORDER — DEXAMETHASONE SODIUM PHOSPHATE 10 MG/ML IJ SOLN
INTRAMUSCULAR | Status: AC
  Filled 2017-06-06: qty 1

## 2017-06-06 MED ORDER — POVIDONE-IODINE 7.5 % EX SOLN
Freq: Once | CUTANEOUS | Status: DC
  Filled 2017-06-06: qty 118

## 2017-06-06 MED ORDER — THROMBIN 20000 UNITS EX SOLR
CUTANEOUS | Status: AC
  Filled 2017-06-06: qty 20000

## 2017-06-06 MED ORDER — BUPIVACAINE-EPINEPHRINE (PF) 0.25% -1:200000 IJ SOLN
INTRAMUSCULAR | Status: AC
  Filled 2017-06-06: qty 30

## 2017-06-06 MED ORDER — ALUM & MAG HYDROXIDE-SIMETH 200-200-20 MG/5ML PO SUSP: 30.0000 mL | Freq: Four times a day (QID) | ORAL | Status: DC | PRN

## 2017-06-06 MED ORDER — FENTANYL CITRATE (PF) 250 MCG/5ML IJ SOLN
INTRAMUSCULAR | Status: AC
  Filled 2017-06-06: qty 5

## 2017-06-06 MED ORDER — SODIUM CHLORIDE 0.9% FLUSH: 3.0000 mL | Freq: Two times a day (BID) | INTRAVENOUS | Status: DC

## 2017-06-06 MED ORDER — ZOLPIDEM TARTRATE 5 MG PO TABS: 5.0000 mg | ORAL_TABLET | Freq: Every evening | ORAL | Status: DC | PRN

## 2017-06-06 MED ORDER — CEFAZOLIN SODIUM-DEXTROSE 2-4 GM/100ML-% IV SOLN
2.0000 g | Freq: Three times a day (TID) | INTRAVENOUS | Status: AC
  Administered 2017-06-06 – 2017-06-07 (×2): 2 g via INTRAVENOUS
  Filled 2017-06-06 (×2): qty 100

## 2017-06-06 MED ORDER — HYDROMORPHONE HCL 1 MG/ML IJ SOLN
INTRAMUSCULAR | Status: AC
Start: 2017-06-06 — End: 2017-06-07
  Filled 2017-06-06: qty 0.5

## 2017-06-06 MED ORDER — SUGAMMADEX SODIUM 200 MG/2ML IV SOLN
INTRAVENOUS | Status: AC
  Filled 2017-06-06: qty 2

## 2017-06-06 MED ORDER — SCOPOLAMINE 1 MG/3DAYS TD PT72
1.0000 | MEDICATED_PATCH | TRANSDERMAL | Status: DC
  Administered 2017-06-06: 1.5 mg via TRANSDERMAL
  Filled 2017-06-06: qty 1

## 2017-06-06 MED ORDER — PHENOL 1.4 % MT LIQD
1.0000 | OROMUCOSAL | Status: DC | PRN
  Administered 2017-06-06: 1 via OROMUCOSAL
  Filled 2017-06-06: qty 177

## 2017-06-06 MED ORDER — SENNOSIDES-DOCUSATE SODIUM 8.6-50 MG PO TABS: 1.0000 | ORAL_TABLET | Freq: Every evening | ORAL | Status: DC | PRN

## 2017-06-06 MED ORDER — PROMETHAZINE HCL 25 MG/ML IJ SOLN
6.2500 mg | INTRAMUSCULAR | Status: DC | PRN
  Administered 2017-06-06: 6.25 mg via INTRAVENOUS

## 2017-06-06 MED ORDER — SCOPOLAMINE 1 MG/3DAYS TD PT72
MEDICATED_PATCH | TRANSDERMAL | Status: AC
  Administered 2017-06-06: 1.5 mg via TRANSDERMAL
  Filled 2017-06-06: qty 1

## 2017-06-06 MED ORDER — ARTIFICIAL TEARS OPHTHALMIC OINT
TOPICAL_OINTMENT | OPHTHALMIC | Status: AC
  Filled 2017-06-06: qty 3.5

## 2017-06-06 MED ORDER — ARTIFICIAL TEARS OPHTHALMIC OINT
TOPICAL_OINTMENT | OPHTHALMIC | Status: DC | PRN
  Administered 2017-06-06: 1 via OPHTHALMIC

## 2017-06-06 MED ORDER — PROPOFOL 10 MG/ML IV BOLUS
INTRAVENOUS | Status: AC
  Filled 2017-06-06: qty 20

## 2017-06-06 MED ORDER — ROCURONIUM BROMIDE 100 MG/10ML IV SOLN
INTRAVENOUS | Status: DC | PRN
  Administered 2017-06-06: 50 mg via INTRAVENOUS
  Administered 2017-06-06: 10 mg via INTRAVENOUS

## 2017-06-06 MED ORDER — ACETAMINOPHEN 325 MG PO TABS: 650.0000 mg | ORAL_TABLET | ORAL | Status: DC | PRN

## 2017-06-06 MED ORDER — DIAZEPAM 5 MG PO TABS
5.0000 mg | ORAL_TABLET | Freq: Four times a day (QID) | ORAL | Status: DC | PRN
  Administered 2017-06-06 – 2017-06-07 (×2): 5 mg via ORAL
  Filled 2017-06-06 (×2): qty 1

## 2017-06-06 MED ORDER — 0.9 % SODIUM CHLORIDE (POUR BTL) OPTIME
TOPICAL | Status: DC | PRN
  Administered 2017-06-06: 1000 mL

## 2017-06-06 MED ORDER — ACETAMINOPHEN 650 MG RE SUPP: 650.0000 mg | RECTAL | Status: DC | PRN

## 2017-06-06 MED ORDER — ONDANSETRON HCL 4 MG PO TABS: 4.0000 mg | ORAL_TABLET | Freq: Four times a day (QID) | ORAL | Status: DC | PRN

## 2017-06-06 MED ORDER — SUGAMMADEX SODIUM 200 MG/2ML IV SOLN
INTRAVENOUS | Status: DC | PRN
  Administered 2017-06-06: 200 mg via INTRAVENOUS

## 2017-06-06 MED ORDER — DEXTROSE 5 % IV SOLN
INTRAVENOUS | Status: DC | PRN
  Administered 2017-06-06: 11:00:00 via INTRAVENOUS

## 2017-06-06 MED ORDER — PANTOPRAZOLE SODIUM 40 MG IV SOLR: 40.0000 mg | Freq: Every day | INTRAVENOUS | Status: DC

## 2017-06-06 MED ORDER — ONDANSETRON HCL 4 MG/2ML IJ SOLN: 4.0000 mg | Freq: Four times a day (QID) | INTRAMUSCULAR | Status: DC | PRN

## 2017-06-06 MED ORDER — BUPIVACAINE-EPINEPHRINE 0.25% -1:200000 IJ SOLN
INTRAMUSCULAR | Status: DC | PRN
  Administered 2017-06-06: 3 mL

## 2017-06-06 MED ORDER — AZELASTINE HCL 0.1 % NA SOLN: 1.0000 | Freq: Every day | NASAL | Status: DC | PRN

## 2017-06-06 MED ORDER — GABAPENTIN 300 MG PO CAPS
300.0000 mg | ORAL_CAPSULE | Freq: Every day | ORAL | Status: DC
  Administered 2017-06-06: 300 mg via ORAL
  Filled 2017-06-06: qty 1

## 2017-06-06 MED ORDER — THROMBIN 20000 UNITS EX KIT
PACK | CUTANEOUS | Status: DC | PRN
  Administered 2017-06-06: 20000 [IU] via TOPICAL

## 2017-06-06 MED ORDER — CEFAZOLIN SODIUM-DEXTROSE 2-4 GM/100ML-% IV SOLN: 2.0000 g | INTRAVENOUS | Status: DC

## 2017-06-06 MED ORDER — PROMETHAZINE HCL 25 MG/ML IJ SOLN
INTRAMUSCULAR | Status: AC
  Filled 2017-06-06: qty 1

## 2017-06-06 MED ORDER — DEXAMETHASONE SODIUM PHOSPHATE 10 MG/ML IJ SOLN
INTRAMUSCULAR | Status: DC | PRN
  Administered 2017-06-06: 10 mg via INTRAVENOUS

## 2017-06-06 MED ORDER — CEFAZOLIN SODIUM-DEXTROSE 2-4 GM/100ML-% IV SOLN
INTRAVENOUS | Status: AC
  Filled 2017-06-06: qty 100

## 2017-06-06 MED ORDER — HYDROMORPHONE HCL 2 MG PO TABS
1.0000 mg | ORAL_TABLET | ORAL | Status: DC | PRN
  Administered 2017-06-06 – 2017-06-07 (×2): 2 mg via ORAL
  Filled 2017-06-06 (×2): qty 1

## 2017-06-06 MED ORDER — BISACODYL 5 MG PO TBEC: 5.0000 mg | DELAYED_RELEASE_TABLET | Freq: Every day | ORAL | Status: DC | PRN

## 2017-06-06 MED ORDER — ONDANSETRON HCL 4 MG/2ML IJ SOLN
INTRAMUSCULAR | Status: DC | PRN
  Administered 2017-06-06: 4 mg via INTRAVENOUS

## 2017-06-06 MED ORDER — LIDOCAINE 2% (20 MG/ML) 5 ML SYRINGE
INTRAMUSCULAR | Status: AC
  Filled 2017-06-06: qty 5

## 2017-06-06 MED ORDER — PANTOPRAZOLE SODIUM 40 MG PO TBEC
40.0000 mg | DELAYED_RELEASE_TABLET | Freq: Every day | ORAL | Status: DC
  Administered 2017-06-06: 40 mg via ORAL
  Filled 2017-06-06: qty 1

## 2017-06-06 SURGICAL SUPPLY — 75 items
APL SKNCLS STERI-STRIP NONHPOA (GAUZE/BANDAGES/DRESSINGS) ×1
BENZOIN TINCTURE PRP APPL 2/3 (GAUZE/BANDAGES/DRESSINGS) ×2 IMPLANT
BIT DRILL NEURO 2X3.1 SFT TUCH (MISCELLANEOUS) ×1 IMPLANT
BIT DRILL SRG 14X2.2XFLT CHK (BIT) ×1 IMPLANT
BIT DRL SRG 14X2.2XFLT CHK (BIT) ×1
BLADE CLIPPER SURG (BLADE) ×2 IMPLANT
BLADE SURG 15 STRL LF DISP TIS (BLADE) ×1 IMPLANT
BLADE SURG 15 STRL SS (BLADE) ×2
BONE VIVIGEN FORMABLE 1.3CC (Bone Implant) ×2 IMPLANT
BUR MATCHSTICK NEURO 3.0 LAGG (BURR) IMPLANT
CARTRIDGE OIL MAESTRO DRILL (MISCELLANEOUS) ×1 IMPLANT
CORDS BIPOLAR (ELECTRODE) ×2 IMPLANT
COVER SURGICAL LIGHT HANDLE (MISCELLANEOUS) ×2 IMPLANT
CRADLE DONUT ADULT HEAD (MISCELLANEOUS) ×2 IMPLANT
DIFFUSER DRILL AIR PNEUMATIC (MISCELLANEOUS) ×2 IMPLANT
DRAIN JACKSON RD 7FR 3/32 (WOUND CARE) IMPLANT
DRAPE C-ARM 42X72 X-RAY (DRAPES) ×2 IMPLANT
DRAPE POUCH INSTRU U-SHP 10X18 (DRAPES) ×2 IMPLANT
DRAPE SURG 17X23 STRL (DRAPES) ×6 IMPLANT
DRILL BIT SKYLINE 14MM (BIT) ×2
DRILL NEURO 2X3.1 SOFT TOUCH (MISCELLANEOUS) ×2
DURAPREP 26ML APPLICATOR (WOUND CARE) ×2 IMPLANT
ELECT COATED BLADE 2.86 ST (ELECTRODE) ×2 IMPLANT
ELECT REM PT RETURN 9FT ADLT (ELECTROSURGICAL) ×2
ELECTRODE REM PT RTRN 9FT ADLT (ELECTROSURGICAL) ×1 IMPLANT
EVACUATOR SILICONE 100CC (DRAIN) IMPLANT
GAUZE SPONGE 4X4 12PLY STRL (GAUZE/BANDAGES/DRESSINGS) ×2 IMPLANT
GAUZE SPONGE 4X4 16PLY XRAY LF (GAUZE/BANDAGES/DRESSINGS) ×2 IMPLANT
GLOVE BIO SURGEON STRL SZ7 (GLOVE) ×2 IMPLANT
GLOVE BIO SURGEON STRL SZ8 (GLOVE) ×2 IMPLANT
GLOVE BIOGEL PI IND STRL 7.0 (GLOVE) ×2 IMPLANT
GLOVE BIOGEL PI IND STRL 8 (GLOVE) ×1 IMPLANT
GLOVE BIOGEL PI INDICATOR 7.0 (GLOVE) ×2
GLOVE BIOGEL PI INDICATOR 8 (GLOVE) ×1
GOWN STRL REUS W/ TWL LRG LVL3 (GOWN DISPOSABLE) ×1 IMPLANT
GOWN STRL REUS W/ TWL XL LVL3 (GOWN DISPOSABLE) ×1 IMPLANT
GOWN STRL REUS W/TWL LRG LVL3 (GOWN DISPOSABLE) ×2
GOWN STRL REUS W/TWL XL LVL3 (GOWN DISPOSABLE) ×2
GRAFT BNE MATRIX VG FRMBL SM 1 (Bone Implant) IMPLANT
INTERLOCK LRDTC CRVCL VBR 6MM (Bone Implant) IMPLANT
IV CATH 14GX2 1/4 (CATHETERS) ×2 IMPLANT
KIT BASIN OR (CUSTOM PROCEDURE TRAY) ×2 IMPLANT
KIT ROOM TURNOVER OR (KITS) ×2 IMPLANT
LORDOTIC CERVICAL VBR 6MM SM (Bone Implant) ×2 IMPLANT
MANIFOLD NEPTUNE II (INSTRUMENTS) ×2 IMPLANT
NDL PRECISIONGLIDE 27X1.5 (NEEDLE) ×1 IMPLANT
NDL SPNL 20GX3.5 QUINCKE YW (NEEDLE) ×1 IMPLANT
NEEDLE PRECISIONGLIDE 27X1.5 (NEEDLE) ×2 IMPLANT
NEEDLE SPNL 20GX3.5 QUINCKE YW (NEEDLE) ×2 IMPLANT
NS IRRIG 1000ML POUR BTL (IV SOLUTION) ×2 IMPLANT
OIL CARTRIDGE MAESTRO DRILL (MISCELLANEOUS) ×2
PACK ORTHO CERVICAL (CUSTOM PROCEDURE TRAY) ×2 IMPLANT
PAD ARMBOARD 7.5X6 YLW CONV (MISCELLANEOUS) ×4 IMPLANT
PATTIES SURGICAL .5 X.5 (GAUZE/BANDAGES/DRESSINGS) IMPLANT
PATTIES SURGICAL .5 X1 (DISPOSABLE) IMPLANT
PIN DISTRACTION 12MM (PIN) ×4
PIN DSTRCT 12XNS SS ACIS (PIN) ×2 IMPLANT
PLATE SKYLINE 12MM (Plate) ×1 IMPLANT
SCREW SKYLINE VAR OS 14MM (Screw) ×4 IMPLANT
SPONGE INTESTINAL PEANUT (DISPOSABLE) ×2 IMPLANT
SPONGE SURGIFOAM ABS GEL 100 (HEMOSTASIS) IMPLANT
STRIP CLOSURE SKIN 1/2X4 (GAUZE/BANDAGES/DRESSINGS) ×2 IMPLANT
SURGIFLO W/THROMBIN 8M KIT (HEMOSTASIS) IMPLANT
SUT MNCRL AB 4-0 PS2 18 (SUTURE) IMPLANT
SUT SILK 4 0 (SUTURE)
SUT SILK 4-0 18XBRD TIE 12 (SUTURE) IMPLANT
SUT VIC AB 2-0 CT2 18 VCP726D (SUTURE) ×2 IMPLANT
SYR BULB IRRIGATION 50ML (SYRINGE) ×2 IMPLANT
SYR CONTROL 10ML LL (SYRINGE) ×4 IMPLANT
TAPE CLOTH 4X10 WHT NS (GAUZE/BANDAGES/DRESSINGS) ×2 IMPLANT
TAPE UMBILICAL COTTON 1/8X30 (MISCELLANEOUS) ×2 IMPLANT
TOWEL OR 17X24 6PK STRL BLUE (TOWEL DISPOSABLE) ×2 IMPLANT
TOWEL OR 17X26 10 PK STRL BLUE (TOWEL DISPOSABLE) ×2 IMPLANT
WATER STERILE IRR 1000ML POUR (IV SOLUTION) ×2 IMPLANT
YANKAUER SUCT BULB TIP NO VENT (SUCTIONS) ×2 IMPLANT

## 2017-06-06 NOTE — Transfer of Care (Signed)
Immediate Anesthesia Transfer of Care Note  Patient: Audrey Adams  Procedure(s) Performed: Procedure(s) with comments: ANTERIOR CERVICAL DECOMPRESSION FUSION, CERVICAL 6-7 WITH INSTRUMENTATION AND ALLOGRAFT (Right) - ANTERIOR CERVICAL DECOMPRESSION FUSION, CERVICAL 6-7 WITH INSTRUMENTATION AND ALLOGRAFT; REQUEST 2 HOURS AND TO BE SECOND FLIP CASE OF THE DAY  Patient Location: PACU  Anesthesia Type:General  Level of Consciousness: awake, oriented, sedated, patient cooperative and responds to stimulation  Airway & Oxygen Therapy: Patient Spontanous Breathing and Patient connected to nasal cannula oxygen  Post-op Assessment: Report given to RN, Post -op Vital signs reviewed and stable, Patient moving all extremities and Patient moving all extremities X 4  Post vital signs: Reviewed and stable  Last Vitals:  Vitals:   06/06/17 0902  BP: (!) 130/55  Pulse: (!) 56  Resp: 18  Temp: 36.8 C    Last Pain:  Vitals:   06/06/17 0902  TempSrc: Oral  PainSc:          Complications: No apparent anesthesia complications

## 2017-06-06 NOTE — Anesthesia Preprocedure Evaluation (Signed)
Anesthesia Evaluation  Patient identified by MRN, date of birth, ID band Patient awake    Reviewed: Allergy & Precautions, NPO status , Patient's Chart, lab work & pertinent test results  History of Anesthesia Complications (+) PONV and history of anesthetic complications  Airway Mallampati: II       Dental no notable dental hx.    Pulmonary neg pulmonary ROS,    breath sounds clear to auscultation       Cardiovascular + Peripheral Vascular Disease   Rhythm:Regular Rate:Normal     Neuro/Psych negative neurological ROS     GI/Hepatic negative GI ROS, Neg liver ROS,   Endo/Other  negative endocrine ROS  Renal/GU negative Renal ROS     Musculoskeletal   Abdominal   Peds  Hematology negative hematology ROS (+)   Anesthesia Other Findings   Reproductive/Obstetrics                             Anesthesia Physical Anesthesia Plan  ASA: II  Anesthesia Plan: General   Post-op Pain Management:    Induction: Intravenous  PONV Risk Score and Plan: 4 or greater and Ondansetron, Dexamethasone, Propofol, Midazolam and Scopolamine patch - Pre-op  Airway Management Planned: Oral ETT  Additional Equipment:   Intra-op Plan:   Post-operative Plan: Extubation in OR  Informed Consent: I have reviewed the patients History and Physical, chart, labs and discussed the procedure including the risks, benefits and alternatives for the proposed anesthesia with the patient or authorized representative who has indicated his/her understanding and acceptance.   Dental advisory given  Plan Discussed with: CRNA  Anesthesia Plan Comments:         Anesthesia Quick Evaluation

## 2017-06-06 NOTE — Anesthesia Procedure Notes (Addendum)
Procedure Name: Intubation Date/Time: 06/06/2017 11:10 AM Performed by: Jacquiline Doe A Patient Re-evaluated:Patient Re-evaluated prior to inductionOxygen Delivery Method: Circle system utilized, Simple face mask, Non-rebreather mask, Ambu bag and Nasal cannula Preoxygenation: Pre-oxygenation with 100% oxygen Intubation Type: IV induction and Cricoid Pressure applied Ventilation: Mask ventilation without difficulty Laryngoscope Size: Mac and 3 Grade View: Grade II Tube type: Oral Tube size: 7.5 mm Number of attempts: 1 Placement Confirmation: ETT inserted through vocal cords under direct vision,  positive ETCO2,  CO2 detector and breath sounds checked- equal and bilateral Secured at: 22 cm Tube secured with: Tape Dental Injury: Teeth and Oropharynx as per pre-operative assessment  Comments: Head in neutral position

## 2017-06-07 ENCOUNTER — Encounter (HOSPITAL_COMMUNITY): Payer: Self-pay | Admitting: Orthopedic Surgery

## 2017-06-07 DIAGNOSIS — M5412 Radiculopathy, cervical region: Secondary | ICD-10-CM | POA: Diagnosis not present

## 2017-06-07 MED FILL — Thrombin For Soln 20000 Unit: CUTANEOUS | Qty: 1 | Status: AC

## 2017-06-07 NOTE — Care Management Obs Status (Signed)
[place] NOTIFICATION   Patient Details  Name: CAELEIGH PROHASKA MRN: [id number] Date of Birth: [birth year]   Medicare Observation Status Notification Given:  Yes    Ninfa Meeker, RN 06/07/2017, 10:17 AM

## 2017-06-07 NOTE — Op Note (Signed)
NAME:  Audrey Adams, Audrey Adams NO.:  MEDICAL RECORD NO.:  [id number]  PHYSICIAN:  Phylliss Bob, MD           DATE OF BIRTH:  DATE OF PROCEDURE:  06/06/2017                              OPERATIVE REPORT   PREOPERATIVE DIAGNOSES: 1. Right-sided C7 radiculopathy. 2. Right-sided neural foraminal stenosis, C6-7.  POSTOPERATIVE DIAGNOSES: 1. Right-sided C7 radiculopathy. 2. Right-sided neural foraminal stenosis, C6-7.  PROCEDURES: 1. Anterior cervical decompression and fusion, C6-7. 2. Placement of anterior instrumentation, C6, C7. 3. Insertion of interbody device x1 (Titan intervertebral spacer). 4. Use of morselized allograft - ViviGen. 5. Intraoperative use of fluoroscopy.  SURGEON:  Phylliss Bob, MD  ASSISTANT:  Pricilla Holm, PAC.  ANESTHESIA:  General endotracheal anesthesia.  COMPLICATIONS:  None.  DISPOSITION:  Stable.  ESTIMATED BLOOD LOSS:  Minimal.  INDICATIONS FOR SURGERY:  Briefly, Audrey Adams is a very pleasant 71- year-old female, who did present to me with ongoing pain in her right arm.  The pain was rather severe.  An MRI did reveal moderate stenosis on the right side at C6-7, correlating to her pain.  Despite appropriate conservative treatment measures, she did continue to have pain, and she did elect to proceed with the procedure reflected above.  OPERATIVE DETAILS:  On June 06, 2017, the patient was brought to surgery and general endotracheal anesthesia was administered.  The patient was placed supine on the hospital bed.  The patient's neck was gently extended.  The neck was prepped and draped in the usual sterile fashion. The patient's arms were secured to her sides, and all bony prominences padded.  After a time-out procedure, a left-sided transverse incision was made, overlying the C6-7 intervertebral space.  The platysma was incised and a Smith-Robinson approach was used and the anterior spine was noted.  A self-retaining  retractor was placed.  Caspar pins were then placed into the C6 and C7 vertebral bodies and distraction was applied.  I then proceeded with a thorough and complete C6-7 intervertebral diskectomy.  A thorough bilateral neuroforaminal decompression was performed.  An excellent decompression was confirmed using a nerve hook.  The endplates were then prepared and the appropriate size interbody spacer was then packed with ViviGen and tamped into position in the usual fashion.  I was very pleased with the press-fit of the implant.  The Caspar pins were then removed and bone wax was placed in their place.  A 12 mm anterior cervical plate was placed over the anterior spine.  A 14 mm variable angle screws were placed, 2 in each vertebral body at C6 and C7.  The screws were then locked to the plate using the Cam-locking mechanism.  I was very pleased with the press-fit of the implant and I was pleased with the purchase of the screws.  The wound was then copiously irrigated.  The wound was then closed in layers using 2-0 Vicryl, followed by 4-0 Monocryl.  Benzoin and Steri-Strips were applied followed by sterile dressing.  All instrument counts were correct at the termination of the procedure.  Of note, Pricilla Holm was my assistant throughout surgery and did aid in retraction, suctioning, and closure from start to finish.     Phylliss Bob, MD     MD/MEDQ  D:  06/06/2017  T:  06/06/2017  Job:  [id number]

## 2017-06-07 NOTE — Care Management CC44 (Signed)
Condition Code 44 Documentation Completed  Patient Details  Name: KYUNG MUTO MRN: [id number] Date of Birth: May 07, 1946   Condition Code 44 given:  Yes Patient signature on Condition Code 44 notice:  Yes Documentation of 2 MD's agreement:  Yes Code 44 added to claim:  Yes    Ninfa Meeker, RN 06/07/2017, 10:17 AM

## 2017-06-07 NOTE — Anesthesia Postprocedure Evaluation (Signed)
Anesthesia Post Note  Patient: Audrey Adams  Procedure(s) Performed: Procedure(s) (LRB): ANTERIOR CERVICAL DECOMPRESSION FUSION, CERVICAL 6-7 WITH INSTRUMENTATION AND ALLOGRAFT (Right)     Patient location during evaluation: PACU Anesthesia Type: General Level of consciousness: awake and alert Pain management: pain level controlled Vital Signs Assessment: post-procedure vital signs reviewed and stable Respiratory status: spontaneous breathing, nonlabored ventilation, respiratory function stable and patient connected to nasal cannula oxygen Cardiovascular status: blood pressure returned to baseline and stable Postop Assessment: no signs of nausea or vomiting Anesthetic complications: no    Last Vitals:  Vitals:   06/07/17 0400 06/07/17 0900  BP: 108/60 (!) 130/98  Pulse: 65   Resp: 18 18  Temp: 36.6 C 36.6 C    Last Pain:  Vitals:   06/07/17 0900  TempSrc: Oral  PainSc:                  Trenesha Alcaide,JAMES TERRILL

## 2017-06-07 NOTE — Progress Notes (Signed)
Pt given D/C instructions with Rx's, verbal understandidng was provided. Pt's IV was removed prior to D/C. Pt incision is clean and dry with no sign of infection. Pt D/C'd home via walking @ 1000 per MD order. Pt is stable @ D/C and has no other needs at this time. Holli Humbles, RN

## 2017-06-07 NOTE — Progress Notes (Signed)
    Patient doing very well with very minimal pain Patient denies R arm pain Has been ambulating and tolerating PO   Physical Exam: Vitals:   06/07/17 0000 06/07/17 0400  BP: (!) 106/57 108/60  Pulse: 66 65  Resp: 18 18  Temp: 98.4 F (36.9 C) 97.9 F (36.6 C)   Neck soft/supple Dressing in place NVI  POD #1 s/p C6/7 ACDF, doing well with resolved R arm pain  - encourage ambulation - Percocet for pain, Valium for muscle spasms - d/c home later today with f/u in 2 weeks

## 2021-08-11 ENCOUNTER — Other Ambulatory Visit: Payer: Self-pay | Admitting: Physician Assistant

## 2021-08-11 DIAGNOSIS — R1030 Lower abdominal pain, unspecified: Secondary | ICD-10-CM

## 2021-09-06 ENCOUNTER — Ambulatory Visit
Admission: RE | Admit: 2021-09-06 | Discharge: 2021-09-06 | Disposition: A | Discharge status: Home / Self Care | Payer: Medicare PPO | Source: Ambulatory Visit | Attending: Physician Assistant | Admitting: Physician Assistant

## 2021-09-06 DIAGNOSIS — R1030 Lower abdominal pain, unspecified: Secondary | ICD-10-CM

## 2021-09-06 MED ORDER — IOPAMIDOL (ISOVUE-370) INJECTION 76%
80.0000 mL | Freq: Once | INTRAVENOUS | Status: AC | PRN
  Administered 2021-09-06: 80 mL via INTRAVENOUS

## 2021-12-07 ENCOUNTER — Other Ambulatory Visit: Payer: Self-pay | Admitting: Gastroenterology

## 2021-12-07 DIAGNOSIS — K8689 Other specified diseases of pancreas: Secondary | ICD-10-CM

## 2021-12-25 ENCOUNTER — Other Ambulatory Visit: Payer: Self-pay

## 2021-12-25 ENCOUNTER — Ambulatory Visit
Admission: RE | Admit: 2021-12-25 | Discharge: 2021-12-25 | Disposition: A | Discharge status: Home / Self Care | Payer: Medicare PPO | Source: Ambulatory Visit | Attending: Gastroenterology | Admitting: Gastroenterology

## 2021-12-25 DIAGNOSIS — K8689 Other specified diseases of pancreas: Secondary | ICD-10-CM

## 2021-12-25 MED ORDER — GADOBENATE DIMEGLUMINE 529 MG/ML IV SOLN
15.0000 mL | Freq: Once | INTRAVENOUS | Status: AC | PRN
  Administered 2021-12-25: 15 mL via INTRAVENOUS

## 2022-09-14 ENCOUNTER — Other Ambulatory Visit: Payer: Self-pay | Admitting: Gastroenterology

## 2022-09-14 DIAGNOSIS — R1310 Dysphagia, unspecified: Secondary | ICD-10-CM

## 2022-09-26 ENCOUNTER — Ambulatory Visit
Admission: RE | Admit: 2022-09-26 | Discharge: 2022-09-26 | Disposition: A | Discharge status: Home / Self Care | Payer: Medicare PPO | Source: Ambulatory Visit | Attending: Gastroenterology | Admitting: Gastroenterology

## 2022-09-26 DIAGNOSIS — R1310 Dysphagia, unspecified: Secondary | ICD-10-CM

## 2022-11-14 IMAGING — MR MR ABDOMEN WO/W CM MRCP
17 of 20 series · 39 of 48 positions shown · IV contrast (15 mL Multihance)
Comparison: None.

CLINICAL DATA: Pancreatic insufficiency.

EXAM:
MRI ABDOMEN WITHOUT AND WITH CONTRAST (INCLUDING MRCP)
TECHNIQUE: Multiplanar multisequence MR imaging of the abdomen was performed
both before and after the administration of intravenous contrast.
Heavily T2-weighted images of the biliary and pancreatic ducts were
obtained, and three-dimensional MRCP images were rendered by post
processing.
CONTRAST:  15mL MULTIHANCE GADOBENATE DIMEGLUMINE 529 MG/ML IV SOLN

[Series 3: T2 · coronal · 5.0mm · 1.56mm/px · 1 of 29 slices shown (1 of 5)]
[im 1/29]
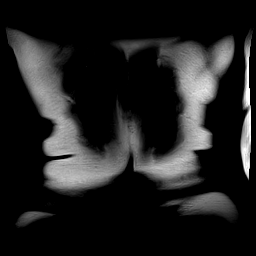

[Series 4: T1 · axial · 3.0mm · 1.19mm/px · z∈[-59,+154]mm · 4 of 144 slices shown]
[im 1/144]
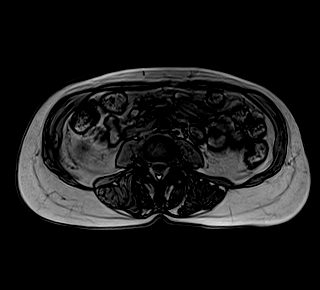
[im 48/144]
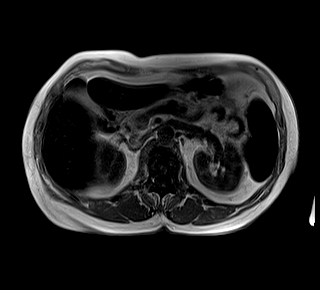
[im 96/144]
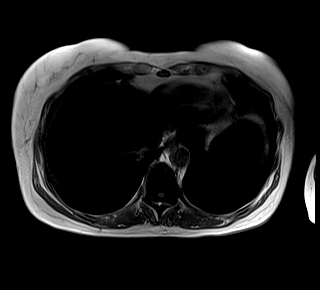
[im 144/144]
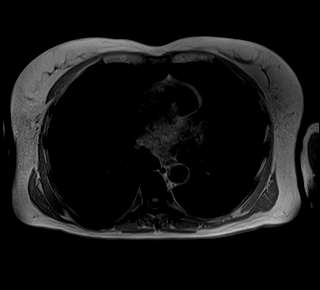

[Series 7: MRCP · coronal · 1.0mm · 0.49mm/px · 3 of 72 slices shown]
[im 1/72]
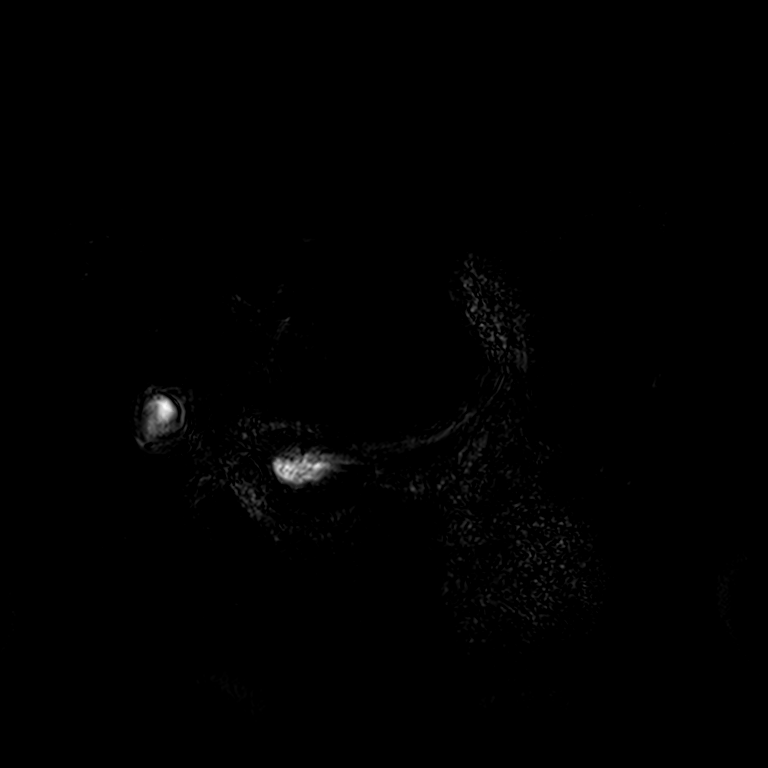
[im 36/72]
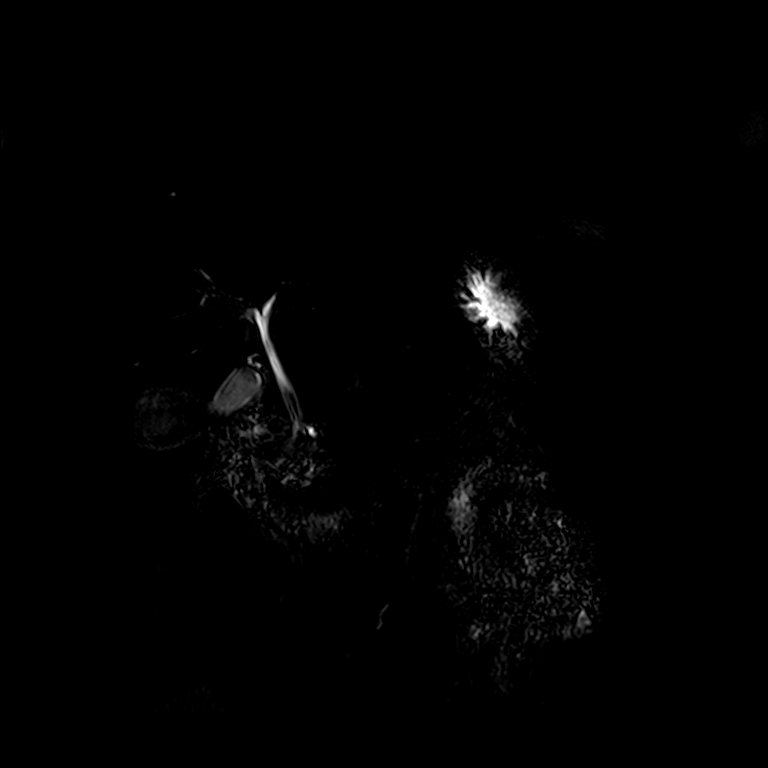
[im 72/72]
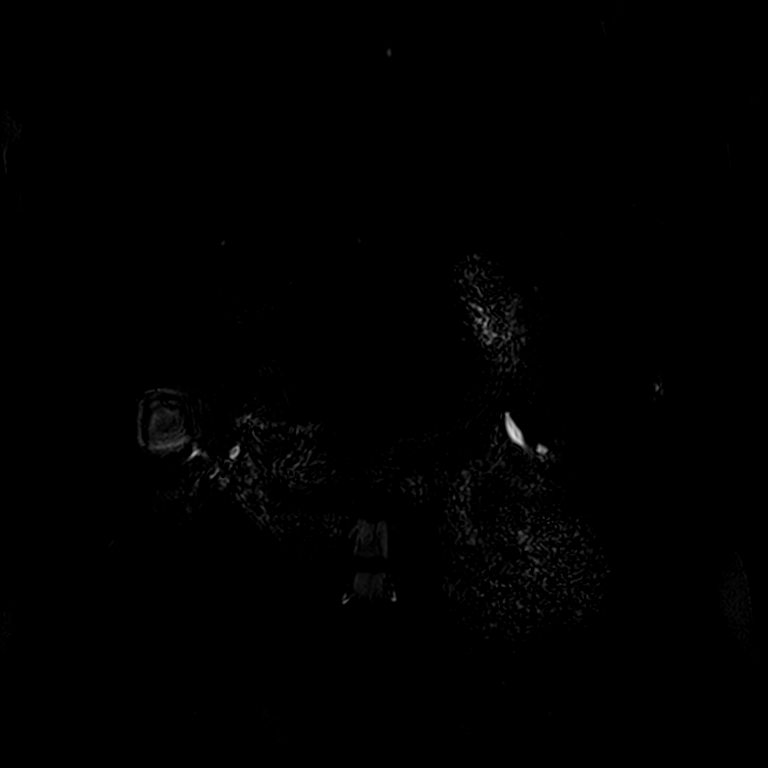

[Series 9: T2 · axial · 5.0mm · 1.48mm/px · z∈[-68,+166]mm · 2 of 40 slices shown (2 of 5)]
[im 1/40]
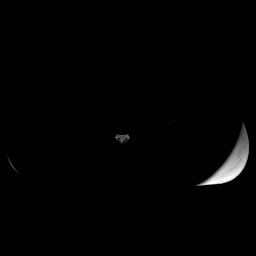
[im 40/40]
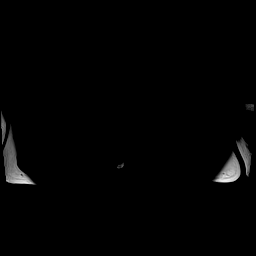

[Series 11: T2 · axial · 6.0mm · 1.22mm/px · 1 of 30 slices shown (3 of 5)]
[im 1/30]
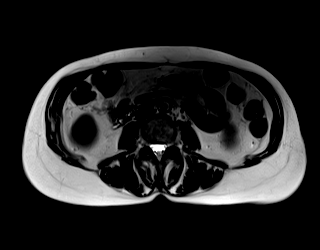

[Series 12: DWI · axial · 5.0mm · 1.42mm/px · z∈[-65,+163]mm · 4 of 117 slices shown (1 of 2)]
[im 1/117]
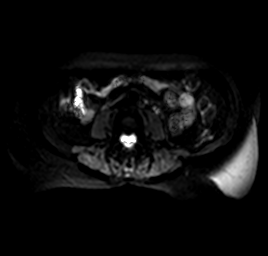
[im 39/117]
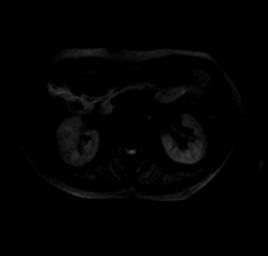
[im 78/117]
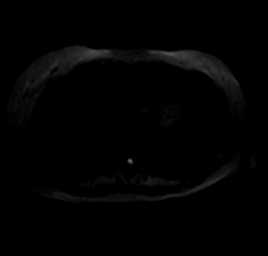
[im 117/117]
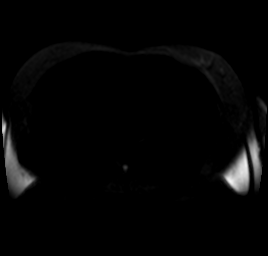

[Series 13: DWI · axial · 5.0mm · 1.42mm/px · 1 of 39 slices shown (2 of 2)]
[im 1/39]
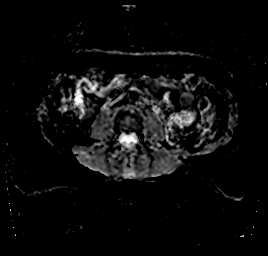

[Series 14: bSSFP · axial · 5.0mm · 1.25mm/px · 1 of 38 slices shown]
[im 1/38]
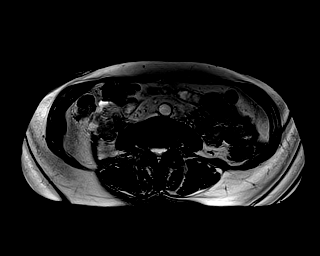

[Series 15: T2 · coronal · 3.0mm · 1.19mm/px · 1 of 19 slices shown (4 of 5)]
[im 1/19]
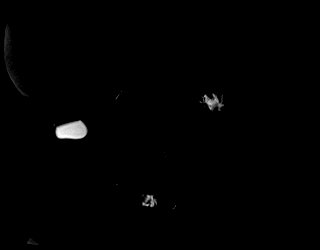

[Series 16: T2 · axial · 6.0mm · 1.22mm/px · 1 of 30 slices shown (5 of 5)]
[im 1/30]
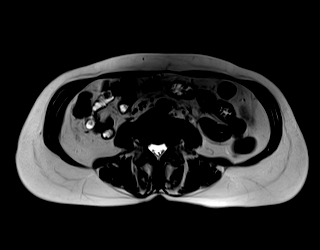

[Series 17: T1 dynamic · axial · non-contrast · 3.0mm · 1.25mm/px · z∈[-59,+154]mm · 3 of 72 slices shown]
[im 1/72]
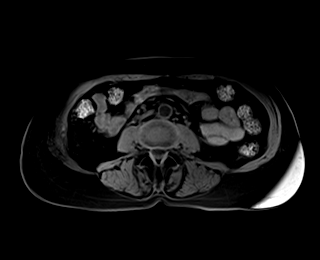
[im 36/72]
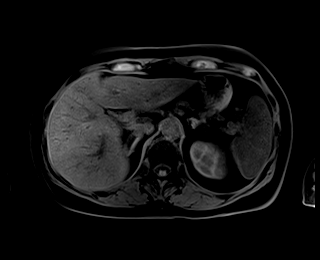
[im 72/72]
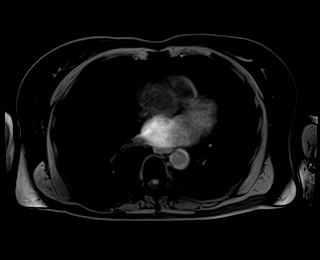

[Series 18: T1 dynamic post-contrast · axial · 3.0mm · 1.25mm/px · z∈[-59,+154]mm · 3 of 72 slices shown (1 of 6)]
[im 1/72]
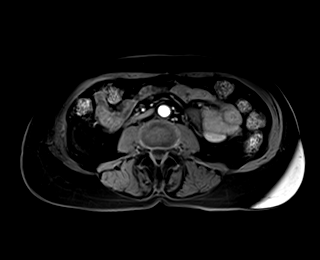
[im 36/72]
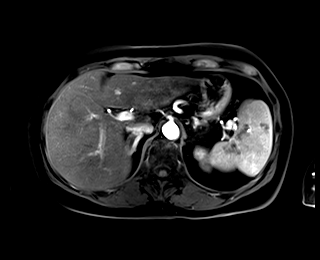
[im 72/72]
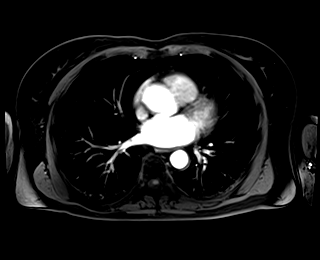

[Series 19: T1 dynamic post-contrast · axial · 3.0mm · 1.25mm/px · z∈[-59,+154]mm · 3 of 72 slices shown (2 of 6)]
[im 1/72]
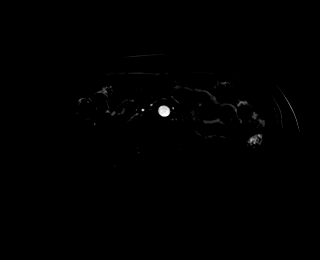
[im 36/72]
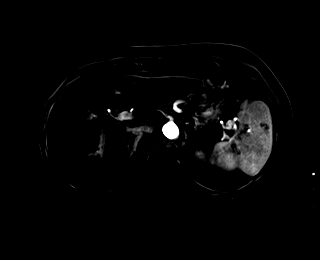
[im 72/72]
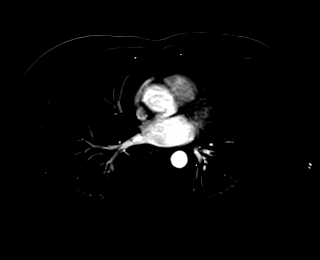

[Series 20: T1 dynamic post-contrast · axial · 3.0mm · 1.25mm/px · z∈[-59,+154]mm · 3 of 72 slices shown (3 of 6)]
[im 1/72]
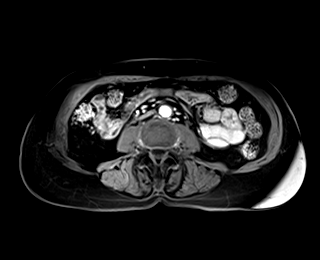
[im 36/72]
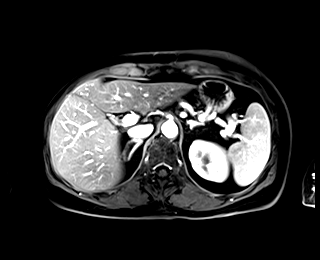
[im 72/72]
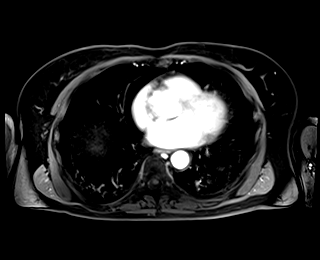

[Series 21: T1 dynamic post-contrast · axial · 3.0mm · 1.25mm/px · z∈[-59,+154]mm · 3 of 72 slices shown (4 of 6)]
[im 1/72]
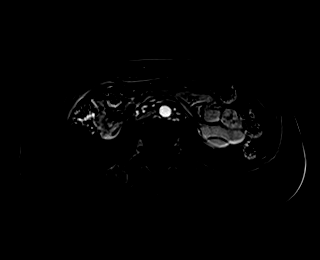
[im 36/72]
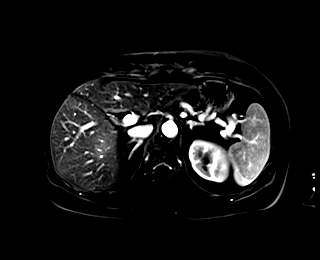
[im 72/72]
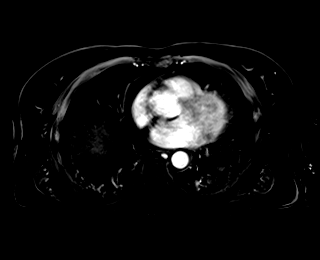

[Series 22: T1 dynamic post-contrast · axial · 3.0mm · 1.25mm/px · z∈[-59,+154]mm · 3 of 72 slices shown (5 of 6)]
[im 1/72]
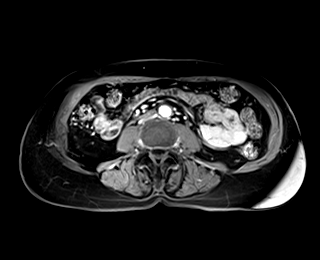
[im 36/72]
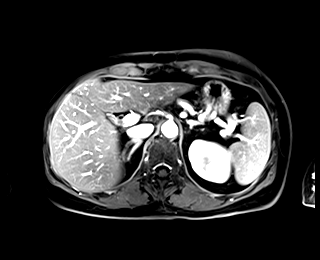
[im 72/72]
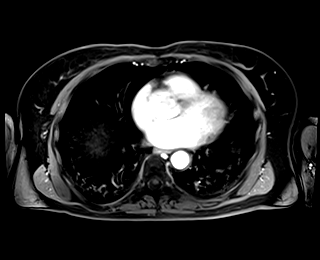

[Series 23: T1 dynamic post-contrast · axial · 3.0mm · 1.25mm/px · z∈[-59,+46]mm · 2 of 72 slices shown (6 of 6)]
[im 1/72]
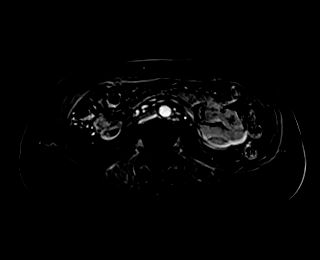
[im 36/72]
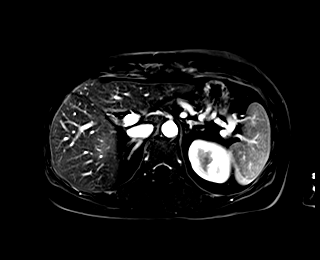

[39 of 48 positions shown; findings below may reference images not displayed]

FINDINGS: Lower chest: No acute findings.

Hepatobiliary: No hepatic masses identified. Diffuse T2
hypointensity of hepatic parenchyma is suspicious for iron
deposition. Gallbladder is unremarkable. No evidence of biliary
ductal dilatation.

Pancreas: Normal appearance of pancreas, with normal signal
intensity. No evidence of pancreatic mass or pancreatitis. No
evidence of pancreatic ductal dilatation.

Spleen: Within normal limits in size. No splenic masses identified.
Diffuse T2 hypointensity of splenic parenchyma is suspicious for
iron deposition.

Adrenals/Urinary Tract: No masses identified. Small benign-appearing
cyst noted in the midpole of the left kidney. No evidence of
hydronephrosis.

Stomach/Bowel: Unremarkable.

Vascular/Lymphatic: No pathologically enlarged lymph nodes
identified. No acute vascular findings.

Other:  None.

Musculoskeletal:  No suspicious bone lesions identified.
IMPRESSION: Normal appearance of the pancreas. No evidence of mass,
pancreatitis, or ductal dilatation.

Findings suspicious hemosiderosis.

## 2024-01-14 ENCOUNTER — Ambulatory Visit: Payer: Medicare PPO | Admitting: Physical Therapy

## 2024-10-01 ENCOUNTER — Ambulatory Visit: Attending: Nurse Practitioner | Admitting: Physical Therapy

## 2024-10-01 ENCOUNTER — Other Ambulatory Visit: Payer: Self-pay

## 2024-10-01 ENCOUNTER — Encounter: Payer: Self-pay | Admitting: Physical Therapy

## 2024-10-01 DIAGNOSIS — M79661 Pain in right lower leg: Secondary | ICD-10-CM | POA: Diagnosis present

## 2024-10-01 DIAGNOSIS — M5416 Radiculopathy, lumbar region: Secondary | ICD-10-CM | POA: Insufficient documentation

## 2024-10-01 NOTE — Therapy (Signed)
 OUTPATIENT PHYSICAL THERAPY THORACOLUMBAR EVALUATION   Patient Name: Audrey Adams MRN: [id number] DOB:1946-05-22, 78 y.o., female Today's Date: 10/01/2024  END OF SESSION:  PT End of Session - 10/01/24 1151     Visit Number 1    Number of Visits 6    Date for Recertification  11/12/24    PT Start Time 1100    PT Stop Time 1140    PT Time Calculation (min) 40 min    Activity Tolerance Patient tolerated treatment well    Behavior During Therapy [facility] for tasks assessed/performed          Past Medical History:  Diagnosis Date   Allergic rhinitis    Anxiety    Cancer (HCC)    lymphoma tx with stem cell transplant 2011   Complication of anesthesia    Hot flashes    PONV (postoperative nausea and vomiting)    Thrombocytopenia    Past Surgical History:  Procedure Laterality Date   ABDOMINAL HYSTERECTOMY     ANTERIOR CERVICAL DECOMP/DISCECTOMY FUSION Right 06/06/2017   Procedure: ANTERIOR CERVICAL DECOMPRESSION FUSION, CERVICAL 6-7 WITH INSTRUMENTATION AND ALLOGRAFT;  Surgeon: Beuford Anes, MD;  Location: [facility] OR;  Service: Orthopedics;  Laterality: Right;  ANTERIOR CERVICAL DECOMPRESSION FUSION, CERVICAL 6-7 WITH INSTRUMENTATION AND ALLOGRAFT; REQUEST 2 HOURS AND TO BE SECOND FLIP CASE OF THE DAY   BACK SURGERY     LIMBAL STEM CELL TRANSPLANT     PLANTAR FASCIA SURGERY Bilateral    10 years ago   Patient Active Problem List   Diagnosis Date Noted   Radiculopathy 06/06/2017   REFERRING PROVIDER: Merlynn Mt   REFERRING DIAG: Lumbar radiculopathy  Rationale for Evaluation and Treatment: Rehabilitation  THERAPY DIAG:  Pain in right lower leg  Radiculopathy, lumbar region  ONSET DATE: Ongoing.  SUBJECTIVE:                                                                                                                                                                                           SUBJECTIVE STATEMENT: The patient presents to the clinic with c/o pain  into right LE and numbness and coldness over her right lower leg, lateral ankle region.  She rates her pain.  Sitting tends to make it worse.  She had a recent ESI which helped some.    PERTINENT HISTORY:  ACDF, lumbar surgery many years ago.    PAIN:  Are you having pain? Yes: NPRS scale: 2/10.   Pain location: Right lower leg, lateral ankle.   Pain description: Numb. Aggravating factors: Maybe increased activity.   Relieving factors: Sitting.    PRECAUTIONS: None  RED FLAGS: None   WEIGHT BEARING RESTRICTIONS: No  FALLS:  Has patient fallen in last 6 months? Yes. Number of falls 2.  LIVING ENVIRONMENT: Lives in: House/apartment Has following equipment at home: None  OCCUPATION: Retired.    PLOF: Independent  PATIENT GOALS: Not have the pain and numbness.   OBJECTIVE:  Note: Objective measures were completed at Evaluation unless otherwise noted.  DIAGNOSTIC FINDINGS:  FINDINGS: There are 5 lumbar-type vertebral bodies. The AP and lateral alignment are normal. The pedicles and spinous processes are intact at all levels. The vertebral body heights are maintained. There is disc space narrowing at L4-5, L5-S1, and L1-2. Facet joint sclerosis is present at L5. There is no abnormal translational motion during flexion or extension.   PATIENT SURVEYS:  ODI:  3/50.    POSTURE: No Significant postural limitations  PALPATION: No palpable tenderness.  LUMBAR ROM:  Full active lumbar flexion and extension.   LOWER EXTREMITY ROM:     WNL.    LOWER EXTREMITY MMT:    WNL.  LUMBAR SPECIAL TESTS:  Right LE longer than right but equalized with SKTC stretch.  (-) SLR and FABER test.  Right Patellar reflex is 1+/4+.    GAIT: WNL.  TREATMENT DATE: 10/01/24:  Supine adductor squeeze with ball x 2 minutes f/b SKTC with 2 minute holds each side, DKTC x 2 minutes and gentle trunk rotation 1 minute each direction.                                                                                                                                    PATIENT EDUCATION:  Education details: See below.  Person educated: Patient Education method: Explanation, Demonstration, Tactile cues, Verbal cues, and Handouts Education comprehension: verbalized understanding, returned demonstration, and verbal cues required  HOME EXERCISE PROGRAM: SKTC  [[id number]]  Adductor Squeeze MET -  Repeat 4 Repetitions, Hold 5 Seconds, Perform 2  SINGLE KNEE TO CHEST STRETCH - SKTC -  Repeat 3 Repetitions, Hold 1 Minute, Complete 1 Set, Perform 3 Times a Day  DOUBLE KNEE TO CHEST STRETCH - DKTC -  Repeat 2 Repetitions, Hold 1 Minute, Complete 1 Set, Perform 3 Times a Day  ASSESSMENT:  CLINICAL IMPRESSION: The patient presents to OPPT with a CC of right lower leg, lateral right ankle numbness.  The patient is very motivated and leads a very active lifestyle.  She exhibits normal active lumbar flexion and extension.  She had a leg length discrepancy with right LE longer than left which appears due to an anterior pelvic rotation which was corrected with a single knee to chest stretch.  She has no palpable pain.  She has normal LE strength.  Her right Patellar reflex is less brisk. Her ODI score is 3/50.   Patient will benefit from skilled physical therapy intervention to address pain, symptoms and deficits.  OBJECTIVE IMPAIRMENTS: pain.   PERSONAL FACTORS: Time since onset  of injury/illness/exacerbation are also affecting patient's functional outcome.   REHAB POTENTIAL: Good  CLINICAL DECISION MAKING: Evolving/moderate complexity  EVALUATION COMPLEXITY: Low   GOALS:  SHORT TERM GOALS: Target date: 11/12/24.  Ind with a HEP. Goal status: INITIAL  2.  Eliminate right LE numbness/symptoms.  Goal status: INITIAL  PLAN:  PT FREQUENCY/DURATION:  6 visits.    PLANNED INTERVENTIONS: 97110-Therapeutic exercises, 97530- Therapeutic activity, [id number]- Neuromuscular re-education, 97535- Self  Care, 02859- Manual therapy, [id number]- Electrical stimulation (manual), [id number]- Ultrasound, [id number] (1-2 muscles), 20561 (3+ muscles)- Dry Needling, Patient/Family education, Cryotherapy, and Moist heat.  PLAN FOR NEXT SESSION: Review HEP, check leg lengths.  Core exercise progression.  Body mechanics training and spinal protection techniques.  Modalities as needed.     Alonza Knisley, [place], PT 10/01/2024, 1:17 PM

## 2024-10-14 ENCOUNTER — Encounter: Payer: Self-pay | Admitting: *Deleted

## 2024-10-14 ENCOUNTER — Ambulatory Visit: Attending: Nurse Practitioner | Admitting: *Deleted

## 2024-10-14 DIAGNOSIS — M5416 Radiculopathy, lumbar region: Secondary | ICD-10-CM | POA: Insufficient documentation

## 2024-10-14 DIAGNOSIS — M79661 Pain in right lower leg: Secondary | ICD-10-CM | POA: Diagnosis present

## 2024-10-14 NOTE — Therapy (Signed)
 OUTPATIENT PHYSICAL THERAPY THORACOLUMBAR TREATMENT   Patient Name: ABBEE Adams MRN: [id number] DOB:11-Feb-1946, 78 y.o., female Today's Date: 10/14/2024  END OF SESSION:  PT End of Session - 10/14/24 1017     Visit Number 2    Number of Visits 6    Date for Recertification  11/12/24    PT Start Time 1017    PT Stop Time 1104    PT Time Calculation (min) 47 min          Past Medical History:  Diagnosis Date   Allergic rhinitis    Anxiety    Cancer (HCC)    lymphoma tx with stem cell transplant 2011   Complication of anesthesia    Hot flashes    PONV (postoperative nausea and vomiting)    Thrombocytopenia    Past Surgical History:  Procedure Laterality Date   ABDOMINAL HYSTERECTOMY     ANTERIOR CERVICAL DECOMP/DISCECTOMY FUSION Right 06/06/2017   Procedure: ANTERIOR CERVICAL DECOMPRESSION FUSION, CERVICAL 6-7 WITH INSTRUMENTATION AND ALLOGRAFT;  Surgeon: Beuford Anes, MD;  Location: [facility] OR;  Service: Orthopedics;  Laterality: Right;  ANTERIOR CERVICAL DECOMPRESSION FUSION, CERVICAL 6-7 WITH INSTRUMENTATION AND ALLOGRAFT; REQUEST 2 HOURS AND TO BE SECOND FLIP CASE OF THE DAY   BACK SURGERY     LIMBAL STEM CELL TRANSPLANT     PLANTAR FASCIA SURGERY Bilateral    10 years ago   Patient Active Problem List   Diagnosis Date Noted   Radiculopathy 06/06/2017   REFERRING PROVIDER: Merlynn Mt   REFERRING DIAG: Lumbar radiculopathy  Rationale for Evaluation and Treatment: Rehabilitation  THERAPY DIAG:  Pain in right lower leg  Radiculopathy, lumbar region  ONSET DATE: Ongoing.  SUBJECTIVE:                                                                                                                                                                                           SUBJECTIVE STATEMENT: The patient presents to the clinic with c/o pain into right LE . To MD  after PT. PERTINENT HISTORY:  ACDF, lumbar surgery many years ago.    PAIN:  Are you having  pain? Yes: NPRS scale: 2/10.   Pain location: Right lower leg, lateral ankle.   Pain description: Numb. Aggravating factors: Maybe increased activity.   Relieving factors: Sitting.    PRECAUTIONS: None  RED FLAGS: None   WEIGHT BEARING RESTRICTIONS: No  FALLS:  Has patient fallen in last 6 months? Yes. Number of falls 2.  LIVING ENVIRONMENT: Lives in: House/apartment Has following equipment at home: None  OCCUPATION: Retired.    PLOF: Independent  PATIENT GOALS: Not have  the pain and numbness.   OBJECTIVE:  Note: Objective measures were completed at Evaluation unless otherwise noted.  DIAGNOSTIC FINDINGS:  FINDINGS: There are 5 lumbar-type vertebral bodies. The AP and lateral alignment are normal. The pedicles and spinous processes are intact at all levels. The vertebral body heights are maintained. There is disc space narrowing at L4-5, L5-S1, and L1-2. Facet joint sclerosis is present at L5. There is no abnormal translational motion during flexion or extension.   PATIENT SURVEYS:  ODI:  3/50.    POSTURE: No Significant postural limitations  PALPATION: No palpable tenderness.  LUMBAR ROM:  Full active lumbar flexion and extension.   LOWER EXTREMITY ROM:     WNL.    LOWER EXTREMITY MMT:    WNL.  LUMBAR SPECIAL TESTS:  Right LE longer than right but equalized with SKTC stretch.  (-) SLR and FABER test.  Right Patellar reflex is 1+/4+.    GAIT: WNL.  TREATMENT DATE: 10/14/24:  Reviewed HEP and DC lumbar rotations at this time due to discomfort  Supine adductor squeeze  SKTC   DKTC                                                                                                                                 Core stab: Bridging  2x10 hold 5 secs Dying bug 2x6 hold 5 secs each side  Cat/ Camel  x 10 for mobility Manual STW/ TPR to RT QL while seated due to unable to lie on LT side or prone.   PATIENT EDUCATION:  Education details: See below.  Person  educated: Patient Education method: Explanation, Demonstration, Tactile cues, Verbal cues, and Handouts Education comprehension: verbalized understanding, returned demonstration, and verbal cues required  HOME EXERCISE PROGRAM: SKTC  [[id number]]  Adductor Squeeze MET -  Repeat 4 Repetitions, Hold 5 Seconds, Perform 2  SINGLE KNEE TO CHEST STRETCH - SKTC -  Repeat 3 Repetitions, Hold 1 Minute, Complete 1 Set, Perform 3 Times a Day  DOUBLE KNEE TO CHEST STRETCH - DKTC -  Repeat 2 Repetitions, Hold 1 Minute, Complete 1 Set, Perform 3 Times a Day  ASSESSMENT:  CLINICAL IMPRESSION: The patient presents to OPPT with a CC of right lower leg, lateral right ankle numbness.Rx focused on review of HEP and core activation exs. Lumbar rotations DC at this time due to possible LB irritation.Pt did well with core exs and handout was given a handout.Good TPR to RT QL.    OBJECTIVE IMPAIRMENTS: pain.   PERSONAL FACTORS: Time since onset of injury/illness/exacerbation are also affecting patient's functional outcome.   REHAB POTENTIAL: Good  CLINICAL DECISION MAKING: Evolving/moderate complexity  EVALUATION COMPLEXITY: Low   GOALS:  SHORT TERM GOALS: Target date: 11/12/24.  Ind with a HEP. Goal status: INITIAL  2.  Eliminate right LE numbness/symptoms.  Goal status: INITIAL  PLAN:  PT FREQUENCY/DURATION:  6 visits.    PLANNED INTERVENTIONS: 97110-Therapeutic exercises, 97530- Therapeutic activity,  02887- Neuromuscular re-education, [id number]- Self Care, 02859- Manual therapy, [id number]- Electrical stimulation (manual), [id number]- Ultrasound, [id number] (1-2 muscles), 20561 (3+ muscles)- Dry Needling, Patient/Family education, Cryotherapy, and Moist heat.  PLAN FOR NEXT SESSION: Review HEP, check leg lengths.  Core exercise progression.  Body mechanics training and spinal protection techniques.  Modalities as needed.     Dacie Mandel,CHRIS, PTA 10/14/2024, 11:34 AM

## 2024-10-20 ENCOUNTER — Ambulatory Visit: Admitting: Physical Therapy

## 2024-11-03 ENCOUNTER — Other Ambulatory Visit: Payer: Self-pay

## 2024-11-03 ENCOUNTER — Ambulatory Visit: Attending: Orthopedic Surgery | Admitting: Physical Therapy

## 2024-11-03 ENCOUNTER — Encounter: Payer: Self-pay | Admitting: Physical Therapy

## 2024-11-03 DIAGNOSIS — M6281 Muscle weakness (generalized): Secondary | ICD-10-CM | POA: Diagnosis present

## 2024-11-03 DIAGNOSIS — R293 Abnormal posture: Secondary | ICD-10-CM | POA: Insufficient documentation

## 2024-11-03 DIAGNOSIS — M25512 Pain in left shoulder: Secondary | ICD-10-CM | POA: Diagnosis present

## 2024-11-03 NOTE — Therapy (Signed)
 OUTPATIENT PHYSICAL THERAPY SHOULDER EVALUATION   Patient Name: Audrey Adams MRN: [id number] DOB:1946-02-22, 78 y.o., female Today's Date: 11/03/2024  END OF SESSION:  PT End of Session - 11/03/24 1057     Visit Number 1    Number of Visits 12    Date for Recertification  12/15/24    Authorization Type [employer]    PT Start Time 1100    PT Stop Time 1140    PT Time Calculation (min) 40 min    Activity Tolerance Patient tolerated treatment well    Behavior During Therapy WFL for tasks assessed/performed          Past Medical History:  Diagnosis Date   Allergic rhinitis    Anxiety    Cancer (HCC)    lymphoma tx with stem cell transplant 2011   Complication of anesthesia    Hot flashes    PONV (postoperative nausea and vomiting)    Thrombocytopenia    Past Surgical History:  Procedure Laterality Date   ABDOMINAL HYSTERECTOMY     ANTERIOR CERVICAL DECOMP/DISCECTOMY FUSION Right 06/06/2017   Procedure: ANTERIOR CERVICAL DECOMPRESSION FUSION, CERVICAL 6-7 WITH INSTRUMENTATION AND ALLOGRAFT;  Surgeon: Beuford Anes, MD;  Location: [facility] OR;  Service: Orthopedics;  Laterality: Right;  ANTERIOR CERVICAL DECOMPRESSION FUSION, CERVICAL 6-7 WITH INSTRUMENTATION AND ALLOGRAFT; REQUEST 2 HOURS AND TO BE SECOND FLIP CASE OF THE DAY   BACK SURGERY     LIMBAL STEM CELL TRANSPLANT     PLANTAR FASCIA SURGERY Bilateral    10 years ago   Patient Active Problem List   Diagnosis Date Noted   Radiculopathy 06/06/2017    PCP: Wells Ditch, MD (Inactive)  REFERRING PROVIDER: Dozier Soulier, MD  REFERRING DIAG: [phone] Calcific tendinitis of left shoulder  THERAPY DIAG:  Left shoulder pain, unspecified chronicity  Muscle weakness (generalized)  Abnormal posture  Rationale for Evaluation and Treatment: Rehabilitation  ONSET DATE: ~1 year  SUBJECTIVE:                                                                                                                                                                                       SUBJECTIVE STATEMENT: Pt states she can't remember when her L shoulder started hurting but maybe a year or year and a half. Has not gotten any shots in her shoulder. Has used voltaren and heat but hasn't gotten any better. Pt states she can move her shoulder. Got an MRI and was suggested to get an arthroscopic surgery but insurance denied it until PT. Most bothersome at night. Will sleep on heating pad. States it has gotten a little bit better.  Hand dominance: Right  PERTINENT HISTORY: Neck surgery 2018, back surgery 2000  PAIN:  Are you having pain? Yes: NPRS scale: at rest 3/10, at worst 10/10 Pain location: Side of L arm Pain description: aching Aggravating factors: laying down and sleeping, lifting, pulling, overhead movement Relieving factors: heating pad, turmeric  PRECAUTIONS: None  RED FLAGS: None   WEIGHT BEARING RESTRICTIONS: No  FALLS:  Has patient fallen in last 6 months? Yes. Number of falls 2 -- slipped in mud and caught herself forward on her arms  LIVING ENVIRONMENT: Lives with: lives with their spouse Lives in: House/apartment Stairs: No Has following equipment at home: None  OCCUPATION: Retired -- Social Research Officer, Government, yard work, housework  PLOF: Independent  PATIENT GOALS:Decrease pain, sleep longer, improve movement  NEXT MD VISIT:   OBJECTIVE:  Note: Objective measures were completed at Evaluation unless otherwise noted.  DIAGNOSTIC FINDINGS:  MRI but unable to see reading  PATIENT SURVEYS:  Quick Dash:  QUICK DASH  Please rate your ability do the following activities in the last week by selecting the number below the appropriate response.   Activities Rating  Open a tight or new jar.  3 = Moderate difficulty  Do heavy household chores (e.g., wash walls, floors). 2 = Mild difficulty  Carry a shopping bag or briefcase 2 = Mild difficulty  Wash your back. 3 = Moderate difficulty  Use a  knife to cut food. 2 = Mild difficulty  Recreational activities in which you take some force or impact through your arm, shoulder or hand (e.g., golf, hammering, tennis, etc.). 3 = Moderate difficulty  During the past week, to what extent has your arm, shoulder or hand problem interfered with your normal social activities with family, friends, neighbors or groups?  2 = Slightly  During the past week, were you limited in your work or other regular daily activities as a result of your arm, shoulder or hand problem? 3 = Moderately limited  Rate the severity of the following symptoms in the last week: Arm, Shoulder, or hand pain. 3 = Moderate  Rate the severity of the following symptoms in the last week: Tingling (pins and needles) in your arm, shoulder or hand. 2 = Mild  During the past week, how much difficulty have you had sleeping because of the pain in your arm, shoulder or hand?  3 = Moderate difficulty  Score QuickDASH Score: 38.6 / 100 = 38.6 %   (A QuickDASH score may not be calculated if there is greater than 1 missing item.)  Minimally Clinically Important Difference (MCID): 15-20 points  (Franchignoni, F. et al. (2013). Minimally clinically important difference of the disabilities of the arm, shoulder, and hand outcome measures (DASH) and its shortened version (Quick DASH). Journal of Orthopaedic & Sports Physical Therapy, 44(1), 30-39)   COGNITION: Overall cognitive status: Within functional limits for tasks assessed     SENSATION: WFL  POSTURE: Flattened thoracic curve, rounded shoulders  UPPER EXTREMITY ROM: slight pain at all end range shoulder ROM on L in AROM and PROM  Active ROM Right eval Left eval  Shoulder flexion 140 140  Shoulder extension 70 65 p!  Shoulder abduction 150 145  Shoulder adduction    Shoulder internal rotation T6 T8  Shoulder external rotation 80 65  Elbow flexion    Elbow extension    Wrist flexion    Wrist extension    Wrist ulnar deviation     Wrist radial deviation    Wrist pronation    Wrist supination    (  Blank rows = not tested)  UPPER EXTREMITY MMT:  MMT Right eval Left eval  Shoulder flexion 4 4-  Shoulder extension 4- 4  Shoulder abduction 4+ 4-  Shoulder adduction    Shoulder internal rotation 5 5  Shoulder external rotation 4+ 4-  Middle trapezius 3+ 4  Lower trapezius 3+ 4  Elbow flexion 5 5  Elbow extension 4 4  Wrist flexion    Wrist extension    Wrist ulnar deviation    Wrist radial deviation    Wrist pronation    Wrist supination    Grip strength (lbs)    (Blank rows = not tested)  SHOULDER SPECIAL TESTS: Did not formally assess  JOINT MOBILITY TESTING:  Joint mobility WNL  PALPATION:  TTP bicep tendon and trigger point notable in bicep muscle belly                                                                                                                             TREATMENT DATE: 11/03/24 Self care: Education only   PATIENT EDUCATION: Education details: Exam findings, POC, shoulder anatomy and PT goals Person educated: Patient Education method: Explanation, Demonstration, and Handouts Education comprehension: verbalized understanding, returned demonstration, and needs further education  HOME EXERCISE PROGRAM: To be initiated  ASSESSMENT:  CLINICAL IMPRESSION: Patient is a 78 y.o. F who was seen today for physical therapy evaluation and treatment for L shoulder pain. Pt does report a fall in which she caught herself on her UEs. Does note history of R shoulder pain. Assessment is significant for L>R rotator cuff and posterior shoulder weakness with s/s of biceps tendon inflammation and trigger points. Pt has generally good glenohumeral mobility -- appears to lack more stability leading to bicep tendon agitation/overuse. Pt will benefit from PT to improve on these deficits for better sleep and daily activities without pain.   OBJECTIVE IMPAIRMENTS: decreased activity tolerance,  decreased coordination, decreased endurance, decreased mobility, decreased ROM, decreased strength, increased fascial restrictions, increased muscle spasms, impaired flexibility, impaired UE functional use, improper body mechanics, postural dysfunction, and pain.   ACTIVITY LIMITATIONS: carrying, lifting, sleeping, bathing, toileting, dressing, reach over head, hygiene/grooming, and locomotion level  PARTICIPATION LIMITATIONS: meal prep, cleaning, laundry, shopping, community activity, and yard work  PERSONAL FACTORS: Age, Fitness, Past/current experiences, and Time since onset of injury/illness/exacerbation are also affecting patient's functional outcome.   REHAB POTENTIAL: Good  CLINICAL DECISION MAKING: Evolving/moderate complexity  EVALUATION COMPLEXITY: Moderate   GOALS: Goals reviewed with patient? Yes  SHORT TERM GOALS: Target date: 11/24/2024   Pt will be ind with initial HEP Baseline: Goal status: INITIAL  2.  Pt will report improved pain by >/=50% Baseline:  Goal status: INITIAL    LONG TERM GOALS: Target date: 12/15/2024   Pt will be ind with management and progression of HEP Baseline:  Goal status: INITIAL  2.  Pt will have improved QuickDASH to </=23 to demo MCID Baseline: QuickDASH Score: 38.6 /  100 = 38.6 % Goal status: INITIAL  3.  Pt will demo pain free and full shoulder motion Baseline:  Goal status: INITIAL  4.  Pt will report >/=75% improvement with her sleep Baseline:  Goal status: INITIAL    PLAN:  PT FREQUENCY: 2x/week  PT DURATION: 6 weeks  PLANNED INTERVENTIONS: 97164- PT Re-evaluation, 97750- Physical Performance Testing, 97110-Therapeutic exercises, 97530- Therapeutic activity, [id number]- Neuromuscular re-education, 97535- Self Care, 02859- Manual therapy, G0283- Electrical stimulation (unattended), 97016- Vasopneumatic device, [id number]- Ultrasound, [id number]- Ionotophoresis 4mg /ml Dexamethasone , 79439 (1-2 muscles), 20561 (3+ muscles)- Dry  Needling, Patient/Family education, Taping, Joint mobilization, Cryotherapy, and Moist heat  PLAN FOR NEXT SESSION: UBE, consider ionto/ultrasound and taping for biceps tendon, progressive strengthening of rotator cuff/posterior shoulder   Peggy Loge April Honor LITTIE Starring, PT, DPT 11/03/2024, 11:58 AM

## 2024-11-10 ENCOUNTER — Ambulatory Visit: Admitting: Physical Therapy

## 2024-11-10 DIAGNOSIS — R293 Abnormal posture: Secondary | ICD-10-CM | POA: Diagnosis present

## 2024-11-10 DIAGNOSIS — M25512 Pain in left shoulder: Secondary | ICD-10-CM | POA: Diagnosis present

## 2024-11-10 DIAGNOSIS — M6281 Muscle weakness (generalized): Secondary | ICD-10-CM | POA: Diagnosis present

## 2024-11-10 NOTE — Therapy (Signed)
 OUTPATIENT PHYSICAL THERAPY SHOULDER TREATMENT  Patient Name: Audrey Adams MRN: [id number] DOB:1946-05-31, 78 y.o., female Today's Date: 11/10/2024  END OF SESSION:  PT End of Session - 11/10/24 1149     Visit Number 2    Number of Visits 12    PT Start Time 1015    PT Stop Time 1111    PT Time Calculation (min) 56 min    Activity Tolerance Patient tolerated treatment well    Behavior During Therapy WFL for tasks assessed/performed           Past Medical History:  Diagnosis Date   Allergic rhinitis    Anxiety    Cancer (HCC)    lymphoma tx with stem cell transplant 2011   Complication of anesthesia    Hot flashes    PONV (postoperative nausea and vomiting)    Thrombocytopenia    Past Surgical History:  Procedure Laterality Date   ABDOMINAL HYSTERECTOMY     ANTERIOR CERVICAL DECOMP/DISCECTOMY FUSION Right 06/06/2017   Procedure: ANTERIOR CERVICAL DECOMPRESSION FUSION, CERVICAL 6-7 WITH INSTRUMENTATION AND ALLOGRAFT;  Surgeon: Beuford Anes, MD;  Location: [facility] OR;  Service: Orthopedics;  Laterality: Right;  ANTERIOR CERVICAL DECOMPRESSION FUSION, CERVICAL 6-7 WITH INSTRUMENTATION AND ALLOGRAFT; REQUEST 2 HOURS AND TO BE SECOND FLIP CASE OF THE DAY   BACK SURGERY     LIMBAL STEM CELL TRANSPLANT     PLANTAR FASCIA SURGERY Bilateral    10 years ago   Patient Active Problem List   Diagnosis Date Noted   Radiculopathy 06/06/2017    PCP: Wells Ditch, MD (Inactive)  REFERRING PROVIDER: Dozier Soulier, MD  REFERRING DIAG: [phone] Calcific tendinitis of left shoulder  THERAPY DIAG:  Left shoulder pain, unspecified chronicity  Muscle weakness (generalized)  Rationale for Evaluation and Treatment: Rehabilitation  ONSET DATE: ~1 year  SUBJECTIVE:                                                                                                                                                                                      SUBJECTIVE STATEMENT: No new  complaints.  PERTINENT HISTORY: Neck surgery 2018, back surgery 2000  PAIN:  Are you having pain? Yes: NPRS scale: at rest 3/10, at worst 10/10 Pain location: Side of L arm Pain description: aching Aggravating factors: laying down and sleeping, lifting, pulling, overhead movement Relieving factors: heating pad, turmeric  PRECAUTIONS: None  RED FLAGS: None   WEIGHT BEARING RESTRICTIONS: No  FALLS:  Has patient fallen in last 6 months? Yes. Number of falls 2 -- slipped in mud and caught herself forward on her arms  LIVING ENVIRONMENT: Lives with: lives with their spouse Lives  in: House/apartment Stairs: No Has following equipment at home: None  OCCUPATION: Retired -- Social Research Officer, Government, yard work, housework  PLOF: Independent  PATIENT GOALS:Decrease pain, sleep longer, improve movement  NEXT MD VISIT:   OBJECTIVE:  Note: Objective measures were completed at Evaluation unless otherwise noted.  DIAGNOSTIC FINDINGS:  MRI but unable to see reading  PATIENT SURVEYS:  Quick Dash:  QUICK DASH  Please rate your ability do the following activities in the last week by selecting the number below the appropriate response.   Activities Rating  Open a tight or new jar.  3 = Moderate difficulty  Do heavy household chores (e.g., wash walls, floors). 2 = Mild difficulty  Carry a shopping bag or briefcase 2 = Mild difficulty  Wash your back. 3 = Moderate difficulty  Use a knife to cut food. 2 = Mild difficulty  Recreational activities in which you take some force or impact through your arm, shoulder or hand (e.g., golf, hammering, tennis, etc.). 3 = Moderate difficulty  During the past week, to what extent has your arm, shoulder or hand problem interfered with your normal social activities with family, friends, neighbors or groups?  2 = Slightly  During the past week, were you limited in your work or other regular daily activities as a result of your arm, shoulder or hand problem? 3 =  Moderately limited  Rate the severity of the following symptoms in the last week: Arm, Shoulder, or hand pain. 3 = Moderate  Rate the severity of the following symptoms in the last week: Tingling (pins and needles) in your arm, shoulder or hand. 2 = Mild  During the past week, how much difficulty have you had sleeping because of the pain in your arm, shoulder or hand?  3 = Moderate difficulty  Score QuickDASH Score: 38.6 / 100 = 38.6 %   (A QuickDASH score may not be calculated if there is greater than 1 missing item.)  Minimally Clinically Important Difference (MCID): 15-20 points  (Franchignoni, F. et al. (2013). Minimally clinically important difference of the disabilities of the arm, shoulder, and hand outcome measures (DASH) and its shortened version (Quick DASH). Journal of Orthopaedic & Sports Physical Therapy, 44(1), 30-39)   COGNITION: Overall cognitive status: Within functional limits for tasks assessed     SENSATION: WFL  POSTURE: Flattened thoracic curve, rounded shoulders  UPPER EXTREMITY ROM: slight pain at all end range shoulder ROM on L in AROM and PROM  Active ROM Right eval Left eval  Shoulder flexion 140 140  Shoulder extension 70 65 p!  Shoulder abduction 150 145  Shoulder adduction    Shoulder internal rotation T6 T8  Shoulder external rotation 80 65  Elbow flexion    Elbow extension    Wrist flexion    Wrist extension    Wrist ulnar deviation    Wrist radial deviation    Wrist pronation    Wrist supination    (Blank rows = not tested)  UPPER EXTREMITY MMT:  MMT Right eval Left eval  Shoulder flexion 4 4-  Shoulder extension 4- 4  Shoulder abduction 4+ 4-  Shoulder adduction    Shoulder internal rotation 5 5  Shoulder external rotation 4+ 4-  Middle trapezius 3+ 4  Lower trapezius 3+ 4  Elbow flexion 5 5  Elbow extension 4 4  Wrist flexion    Wrist extension    Wrist ulnar deviation    Wrist radial deviation    Wrist pronation  Wrist supination    Grip strength (lbs)    (Blank rows = not tested)  SHOULDER SPECIAL TESTS: Did not formally assess  JOINT MOBILITY TESTING:  Joint mobility WNL  PALPATION:  TTP bicep tendon and trigger point notable in bicep muscle belly                                                                                                                             TREATMENT DATE:   11/10/24:  UBE x 8 minutes f/b combo e'stim/US  at 1.50 W/CM2 x 8 minutes to patient's left shoulder f/b STW/M x 8 minutes f/b HMP and IFC at 80-150 Hz on 40% scan x 20 minutes.  Normal modality resposne following removal of modality.       11/03/24 Self care: Education only   PATIENT EDUCATION: Education details: Exam findings, POC, shoulder anatomy and PT goals Person educated: Patient Education method: Explanation, Demonstration, and Handouts Education comprehension: verbalized understanding, returned demonstration, and needs further education  HOME EXERCISE PROGRAM: To be initiated  ASSESSMENT:  CLINICAL IMPRESSION: Patient tolerated treatment very well without complaint.  Her left shoulder hurts most at night and wakes.    OBJECTIVE IMPAIRMENTS: decreased activity tolerance, decreased coordination, decreased endurance, decreased mobility, decreased ROM, decreased strength, increased fascial restrictions, increased muscle spasms, impaired flexibility, impaired UE functional use, improper body mechanics, postural dysfunction, and pain.   ACTIVITY LIMITATIONS: carrying, lifting, sleeping, bathing, toileting, dressing, reach over head, hygiene/grooming, and locomotion level  PARTICIPATION LIMITATIONS: meal prep, cleaning, laundry, shopping, community activity, and yard work  PERSONAL FACTORS: Age, Fitness, Past/current experiences, and Time since onset of injury/illness/exacerbation are also affecting patient's functional outcome.   REHAB POTENTIAL: Good  CLINICAL DECISION MAKING:  Evolving/moderate complexity  EVALUATION COMPLEXITY: Moderate   GOALS: Goals reviewed with patient? Yes  SHORT TERM GOALS: Target date: 11/24/2024   Pt will be ind with initial HEP Baseline: Goal status: INITIAL  2.  Pt will report improved pain by >/=50% Baseline:  Goal status: INITIAL    LONG TERM GOALS: Target date: 12/15/2024   Pt will be ind with management and progression of HEP Baseline:  Goal status: INITIAL  2.  Pt will have improved QuickDASH to </=23 to demo MCID Baseline: QuickDASH Score: 38.6 / 100 = 38.6 % Goal status: INITIAL  3.  Pt will demo pain free and full shoulder motion Baseline:  Goal status: INITIAL  4.  Pt will report >/=75% improvement with her sleep Baseline:  Goal status: INITIAL    PLAN:  PT FREQUENCY: 2x/week  PT DURATION: 6 weeks  PLANNED INTERVENTIONS: 97164- PT Re-evaluation, 97750- Physical Performance Testing, 97110-Therapeutic exercises, 97530- Therapeutic activity, [id number]- Neuromuscular re-education, 97535- Self Care, 02859- Manual therapy, G0283- Electrical stimulation (unattended), 97016- Vasopneumatic device, [id number]- Ultrasound, [id number]- Ionotophoresis 4mg /ml Dexamethasone , 79439 (1-2 muscles), 20561 (3+ muscles)- Dry Needling, Patient/Family education, Taping, Joint mobilization, Cryotherapy, and Moist heat  PLAN FOR NEXT SESSION: UBE, consider ionto/ultrasound and taping for biceps  tendon, progressive strengthening of rotator cuff/posterior shoulder   Karyna Bessler, PT, DPT 11/10/2024, 11:53 AM

## 2024-11-13 ENCOUNTER — Ambulatory Visit: Admitting: *Deleted

## 2024-11-13 ENCOUNTER — Encounter: Payer: Self-pay | Admitting: *Deleted

## 2024-11-13 DIAGNOSIS — M6281 Muscle weakness (generalized): Secondary | ICD-10-CM

## 2024-11-13 DIAGNOSIS — M25512 Pain in left shoulder: Secondary | ICD-10-CM | POA: Diagnosis not present

## 2024-11-13 DIAGNOSIS — R293 Abnormal posture: Secondary | ICD-10-CM

## 2024-11-13 NOTE — Therapy (Signed)
 OUTPATIENT PHYSICAL THERAPY SHOULDER TREATMENT  Patient Name: Audrey Adams MRN: [id number] DOB:[birth year], 78 y.o., female Today's Date: 11/13/2024  END OF SESSION:  PT End of Session - 11/13/24 1117     Visit Number 3    Number of Visits 12    Date for Recertification  12/15/24    PT Start Time 1100    PT Stop Time 1155    PT Time Calculation (min) 55 min           Past Medical History:  Diagnosis Date   Allergic rhinitis    Anxiety    Cancer (HCC)    lymphoma tx with stem cell transplant 2011   Complication of anesthesia    Hot flashes    PONV (postoperative nausea and vomiting)    Thrombocytopenia    Past Surgical History:  Procedure Laterality Date   ABDOMINAL HYSTERECTOMY     ANTERIOR CERVICAL DECOMP/DISCECTOMY FUSION Right 06/06/2017   Procedure: ANTERIOR CERVICAL DECOMPRESSION FUSION, CERVICAL 6-7 WITH INSTRUMENTATION AND ALLOGRAFT;  Surgeon: Beuford Anes, MD;  Location: [facility] OR;  Service: Orthopedics;  Laterality: Right;  ANTERIOR CERVICAL DECOMPRESSION FUSION, CERVICAL 6-7 WITH INSTRUMENTATION AND ALLOGRAFT; REQUEST 2 HOURS AND TO BE SECOND FLIP CASE OF THE DAY   BACK SURGERY     LIMBAL STEM CELL TRANSPLANT     PLANTAR FASCIA SURGERY Bilateral    10 years ago   Patient Active Problem List   Diagnosis Date Noted   Radiculopathy 06/06/2017    PCP: Wells Ditch, MD (Inactive)  REFERRING PROVIDER: Dozier Soulier, MD  REFERRING DIAG: [phone] Calcific tendinitis of left shoulder  THERAPY DIAG:  Left shoulder pain, unspecified chronicity  Muscle weakness (generalized)  Abnormal posture  Rationale for Evaluation and Treatment: Rehabilitation  ONSET DATE: ~1 year  SUBJECTIVE:                                                                                                                                                                                      SUBJECTIVE STATEMENT: No new complaints.  PERTINENT HISTORY: Neck surgery 2018, back  surgery 2000  PAIN:  Are you having pain? Yes: NPRS scale: at rest 3/10, at worst 10/10 Pain location: Side of L arm Pain description: aching Aggravating factors: laying down and sleeping, lifting, pulling, overhead movement Relieving factors: heating pad, turmeric  PRECAUTIONS: None  RED FLAGS: None   WEIGHT BEARING RESTRICTIONS: No  FALLS:  Has patient fallen in last 6 months? Yes. Number of falls 2 -- slipped in mud and caught herself forward on her arms  LIVING ENVIRONMENT: Lives with: lives with their spouse Lives in: House/apartment Stairs: No Has following equipment at  home: None  OCCUPATION: Retired -- Social Research Officer, Government, yard work, housework  PLOF: Independent  PATIENT GOALS:Decrease pain, sleep longer, improve movement  NEXT MD VISIT:   OBJECTIVE:  Note: Objective measures were completed at Evaluation unless otherwise noted.  DIAGNOSTIC FINDINGS:  MRI but unable to see reading  PATIENT SURVEYS:  Quick Dash:  QUICK DASH  Please rate your ability do the following activities in the last week by selecting the number below the appropriate response.   Activities Rating  Open a tight or new jar.  3 = Moderate difficulty  Do heavy household chores (e.g., wash walls, floors). 2 = Mild difficulty  Carry a shopping bag or briefcase 2 = Mild difficulty  Wash your back. 3 = Moderate difficulty  Use a knife to cut food. 2 = Mild difficulty  Recreational activities in which you take some force or impact through your arm, shoulder or hand (e.g., golf, hammering, tennis, etc.). 3 = Moderate difficulty  During the past week, to what extent has your arm, shoulder or hand problem interfered with your normal social activities with family, friends, neighbors or groups?  2 = Slightly  During the past week, were you limited in your work or other regular daily activities as a result of your arm, shoulder or hand problem? 3 = Moderately limited  Rate the severity of the following  symptoms in the last week: Arm, Shoulder, or hand pain. 3 = Moderate  Rate the severity of the following symptoms in the last week: Tingling (pins and needles) in your arm, shoulder or hand. 2 = Mild  During the past week, how much difficulty have you had sleeping because of the pain in your arm, shoulder or hand?  3 = Moderate difficulty  Score QuickDASH Score: 38.6 / 100 = 38.6 %   (A QuickDASH score may not be calculated if there is greater than 1 missing item.)  Minimally Clinically Important Difference (MCID): 15-20 points  (Franchignoni, F. et al. (2013). Minimally clinically important difference of the disabilities of the arm, shoulder, and hand outcome measures (DASH) and its shortened version (Quick DASH). Journal of Orthopaedic & Sports Physical Therapy, 44(1), 30-39)   COGNITION: Overall cognitive status: Within functional limits for tasks assessed     SENSATION: WFL  POSTURE: Flattened thoracic curve, rounded shoulders  UPPER EXTREMITY ROM: slight pain at all end range shoulder ROM on L in AROM and PROM  Active ROM Right eval Left eval  Shoulder flexion 140 140  Shoulder extension 70 65 p!  Shoulder abduction 150 145  Shoulder adduction    Shoulder internal rotation T6 T8  Shoulder external rotation 80 65  Elbow flexion    Elbow extension    Wrist flexion    Wrist extension    Wrist ulnar deviation    Wrist radial deviation    Wrist pronation    Wrist supination    (Blank rows = not tested)  UPPER EXTREMITY MMT:  MMT Right eval Left eval  Shoulder flexion 4 4-  Shoulder extension 4- 4  Shoulder abduction 4+ 4-  Shoulder adduction    Shoulder internal rotation 5 5  Shoulder external rotation 4+ 4-  Middle trapezius 3+ 4  Lower trapezius 3+ 4  Elbow flexion 5 5  Elbow extension 4 4  Wrist flexion    Wrist extension    Wrist ulnar deviation    Wrist radial deviation    Wrist pronation    Wrist supination    Grip strength (lbs)    (  Blank rows =  not tested)  SHOULDER SPECIAL TESTS: Did not formally assess  JOINT MOBILITY TESTING:  Joint mobility WNL  PALPATION:  TTP bicep tendon and trigger point notable in bicep muscle belly                                                                                                                             TREATMENT DATE:   11/10/24:  UBE x 8 minutes Pulleys x 5 mins UE ranger x  6 mins Combo e'stim/US  at 1.50 W/CM2 x 8 mins minutes to patient's left shoulder  HMP and IFC at 80-150 Hz on 40% scan x 15 minutes.  Normal modality resposne following removal of modality.       11/03/24 Self care: Education only   PATIENT EDUCATION: Education details: Exam findings, POC, shoulder anatomy and PT goals Person educated: Patient Education method: Explanation, Demonstration, and Handouts Education comprehension: verbalized understanding, returned demonstration, and needs further education  HOME EXERCISE PROGRAM: To be initiated  ASSESSMENT:  CLINICAL IMPRESSION: Patient tolerated treatment very well without complaint.  Her left shoulder hurts most at night and wakes.    OBJECTIVE IMPAIRMENTS: decreased activity tolerance, decreased coordination, decreased endurance, decreased mobility, decreased ROM, decreased strength, increased fascial restrictions, increased muscle spasms, impaired flexibility, impaired UE functional use, improper body mechanics, postural dysfunction, and pain.   ACTIVITY LIMITATIONS: carrying, lifting, sleeping, bathing, toileting, dressing, reach over head, hygiene/grooming, and locomotion level  PARTICIPATION LIMITATIONS: meal prep, cleaning, laundry, shopping, community activity, and yard work  PERSONAL FACTORS: Age, Fitness, Past/current experiences, and Time since onset of injury/illness/exacerbation are also affecting patient's functional outcome.   REHAB POTENTIAL: Good  CLINICAL DECISION MAKING: Evolving/moderate complexity  EVALUATION  COMPLEXITY: Moderate   GOALS: Goals reviewed with patient? Yes  SHORT TERM GOALS: Target date: 11/24/2024   Pt will be ind with initial HEP Baseline: Goal status: INITIAL  2.  Pt will report improved pain by >/=50% Baseline:  Goal status: INITIAL    LONG TERM GOALS: Target date: 12/15/2024   Pt will be ind with management and progression of HEP Baseline:  Goal status: INITIAL  2.  Pt will have improved QuickDASH to </=23 to demo MCID Baseline: QuickDASH Score: 38.6 / 100 = 38.6 % Goal status: INITIAL  3.  Pt will demo pain free and full shoulder motion Baseline:  Goal status: INITIAL  4.  Pt will report >/=75% improvement with her sleep Baseline:  Goal status: INITIAL    PLAN:  PT FREQUENCY: 2x/week  PT DURATION: 6 weeks  PLANNED INTERVENTIONS: 97164- PT Re-evaluation, 97750- Physical Performance Testing, 97110-Therapeutic exercises, 97530- Therapeutic activity, [id number]- Neuromuscular re-education, 97535- Self Care, 02859- Manual therapy, G0283- Electrical stimulation (unattended), 97016- Vasopneumatic device, [id number]- Ultrasound, [id number]- Ionotophoresis 4mg /ml Dexamethasone , 79439 (1-2 muscles), 20561 (3+ muscles)- Dry Needling, Patient/Family education, Taping, Joint mobilization, Cryotherapy, and Moist heat  PLAN FOR NEXT SESSION: UBE, consider ionto/ultrasound and taping for biceps tendon, progressive strengthening of rotator  cuff/posterior shoulder   Desmond Tufano,CHRIS, PTA,  11/13/2024, 1:11 PM

## 2024-11-20 ENCOUNTER — Ambulatory Visit: Admitting: Physical Therapy

## 2024-11-20 DIAGNOSIS — M25512 Pain in left shoulder: Secondary | ICD-10-CM | POA: Diagnosis not present

## 2024-11-20 DIAGNOSIS — R293 Abnormal posture: Secondary | ICD-10-CM

## 2024-11-20 DIAGNOSIS — M6281 Muscle weakness (generalized): Secondary | ICD-10-CM

## 2024-11-20 NOTE — Therapy (Signed)
 OUTPATIENT PHYSICAL THERAPY SHOULDER TREATMENT  Patient Name: Audrey Adams MRN: [id number] DOB:11-16-1946, 78 y.o., female Today's Date: 11/20/2024  END OF SESSION:  PT End of Session - 11/20/24 1515     Visit Number 4    Number of Visits 12    Date for Recertification  12/15/24    Authorization Type [employer]    PT Start Time 1515    PT Stop Time 1555    PT Time Calculation (min) 40 min    Behavior During Therapy [facility] for tasks assessed/performed            Past Medical History:  Diagnosis Date   Allergic rhinitis    Anxiety    Cancer (HCC)    lymphoma tx with stem cell transplant 2011   Complication of anesthesia    Hot flashes    PONV (postoperative nausea and vomiting)    Thrombocytopenia    Past Surgical History:  Procedure Laterality Date   ABDOMINAL HYSTERECTOMY     ANTERIOR CERVICAL DECOMP/DISCECTOMY FUSION Right 06/06/2017   Procedure: ANTERIOR CERVICAL DECOMPRESSION FUSION, CERVICAL 6-7 WITH INSTRUMENTATION AND ALLOGRAFT;  Surgeon: Beuford Anes, MD;  Location: [facility] OR;  Service: Orthopedics;  Laterality: Right;  ANTERIOR CERVICAL DECOMPRESSION FUSION, CERVICAL 6-7 WITH INSTRUMENTATION AND ALLOGRAFT; REQUEST 2 HOURS AND TO BE SECOND FLIP CASE OF THE DAY   BACK SURGERY     LIMBAL STEM CELL TRANSPLANT     PLANTAR FASCIA SURGERY Bilateral    10 years ago   Patient Active Problem List   Diagnosis Date Noted   Radiculopathy 06/06/2017    PCP: Wells Ditch, MD (Inactive)  REFERRING PROVIDER: Dozier Soulier, MD  REFERRING DIAG: [phone] Calcific tendinitis of left shoulder  THERAPY DIAG:  No diagnosis found.  Rationale for Evaluation and Treatment: Rehabilitation  ONSET DATE: ~1 year  SUBJECTIVE:                                                                                                                                                                                      SUBJECTIVE STATEMENT: Pt states shoulder is feeling about the  same. 2/10 pain today. States that her shoulder would feel good for a little bit after PT treatment but won't last.   PERTINENT HISTORY: Neck surgery 2018, back surgery 2000  PAIN:  Are you having pain? Yes: NPRS scale: at rest 3/10, at worst 10/10 Pain location: Side of L arm Pain description: aching Aggravating factors: laying down and sleeping, lifting, pulling, overhead movement Relieving factors: heating pad, turmeric  PRECAUTIONS: None  RED FLAGS: None   WEIGHT BEARING RESTRICTIONS: No  FALLS:  Has patient fallen in last 6  months? Yes. Number of falls 2 -- slipped in mud and caught herself forward on her arms  LIVING ENVIRONMENT: Lives with: lives with their spouse Lives in: House/apartment Stairs: No Has following equipment at home: None  OCCUPATION: Retired -- Social Research Officer, Government, yard work, housework  PLOF: Independent  PATIENT GOALS:Decrease pain, sleep longer, improve movement  NEXT MD VISIT:   OBJECTIVE:  Note: Objective measures were completed at Evaluation unless otherwise noted.  DIAGNOSTIC FINDINGS:  MRI but unable to see reading  PATIENT SURVEYS:  Quick Dash:  QUICK DASH  Please rate your ability do the following activities in the last week by selecting the number below the appropriate response.   Activities Rating  Open a tight or new jar.  3 = Moderate difficulty  Do heavy household chores (e.g., wash walls, floors). 2 = Mild difficulty  Carry a shopping bag or briefcase 2 = Mild difficulty  Wash your back. 3 = Moderate difficulty  Use a knife to cut food. 2 = Mild difficulty  Recreational activities in which you take some force or impact through your arm, shoulder or hand (e.g., golf, hammering, tennis, etc.). 3 = Moderate difficulty  During the past week, to what extent has your arm, shoulder or hand problem interfered with your normal social activities with family, friends, neighbors or groups?  2 = Slightly  During the past week, were you limited  in your work or other regular daily activities as a result of your arm, shoulder or hand problem? 3 = Moderately limited  Rate the severity of the following symptoms in the last week: Arm, Shoulder, or hand pain. 3 = Moderate  Rate the severity of the following symptoms in the last week: Tingling (pins and needles) in your arm, shoulder or hand. 2 = Mild  During the past week, how much difficulty have you had sleeping because of the pain in your arm, shoulder or hand?  3 = Moderate difficulty  Score QuickDASH Score: 38.6 / 100 = 38.6 %   (A QuickDASH score may not be calculated if there is greater than 1 missing item.)  Minimally Clinically Important Difference (MCID): 15-20 points  (Franchignoni, F. et al. (2013). Minimally clinically important difference of the disabilities of the arm, shoulder, and hand outcome measures (DASH) and its shortened version (Quick DASH). Journal of Orthopaedic & Sports Physical Therapy, 44(1), 30-39)   COGNITION: Overall cognitive status: Within functional limits for tasks assessed     SENSATION: WFL  POSTURE: Flattened thoracic curve, rounded shoulders  UPPER EXTREMITY ROM: slight pain at all end range shoulder ROM on L in AROM and PROM  Active ROM Right eval Left eval  Shoulder flexion 140 140  Shoulder extension 70 65 p!  Shoulder abduction 150 145  Shoulder adduction    Shoulder internal rotation T6 T8  Shoulder external rotation 80 65  Elbow flexion    Elbow extension    Wrist flexion    Wrist extension    Wrist ulnar deviation    Wrist radial deviation    Wrist pronation    Wrist supination    (Blank rows = not tested)  UPPER EXTREMITY MMT:  MMT Right eval Left eval  Shoulder flexion 4 4-  Shoulder extension 4- 4  Shoulder abduction 4+ 4-  Shoulder adduction    Shoulder internal rotation 5 5  Shoulder external rotation 4+ 4-  Middle trapezius 3+ 4  Lower trapezius 3+ 4  Elbow flexion 5 5  Elbow extension 4 4  Wrist  flexion     Wrist extension    Wrist ulnar deviation    Wrist radial deviation    Wrist pronation    Wrist supination    Grip strength (lbs)    (Blank rows = not tested)  SHOULDER SPECIAL TESTS: Did not formally assess  JOINT MOBILITY TESTING:  Joint mobility WNL  PALPATION:  TTP bicep tendon and trigger point notable in bicep muscle belly                                                                                                                             TREATMENT DATE:  11/20/24: Nustep L4 x 10 min UEs/LEs Doorway pec stretch low, mid, high 2x30 each Bicep stretch against wall 2x30 Seated scap squeeze against ball 2x10 Seated tricep ext red TB Standing shoulder extension red TB 2x10 Standing row red TB 2x10 STM & TPR L bicep and brachialis Self care: shoulder positioning for sleep, shoulder anatomy    11/10/24:  UBE x 8 minutes Pulleys x 5 mins UE ranger x  6 mins Combo e'stim/US  at 1.50 W/CM2 x 8 mins minutes to patient's left shoulder  HMP and IFC at 80-150 Hz on 40% scan x 15 minutes.  Normal modality resposne following removal of modality.     PATIENT EDUCATION: Education details: HEP Person educated: Patient Education method: Programmer, Multimedia, Facilities Manager, and Handouts Education comprehension: verbalized understanding, returned demonstration, and needs further education  HOME EXERCISE PROGRAM: Access Code: [email or web address] URL: [email or web address] Date: 11/20/2024 Prepared by: Cephus Tupy April Earnie Starring  Exercises - Doorway Pec Stretch at 60 Degrees Abduction with Arm Straight  - 2 x daily - 7 x weekly - 2 sets - 30 sec hold - Doorway Pec Stretch at 60 Elevation  - 2 x daily - 7 x weekly - 2 sets - 30 sec hold - Doorway Pec Stretch at 90 Degrees Abduction  - 2 x daily - 7 x weekly - 3 sets - 30 sec hold - Standing Bicep Stretch at Wall  - 2 x daily - 7 x weekly - 2 sets - 30 sec hold - Seated Elbow Extension with Self-Anchored Resistance  - 1 x  daily - 7 x weekly - 2 sets - 10 reps - Shoulder extension with resistance - Neutral  - 1 x daily - 7 x weekly - 2 sets - 10 reps  ASSESSMENT:  CLINICAL IMPRESSION: Treatment focused on reducing bicep muscle belly and tendon inflammation/pain. Initiated anterior shoulder and bicep stretching today as well as posterior shoulder/tricep strengthening. Discussed sleep positioning to reduce pressure on bicep tendon. Started pt on HEP.   OBJECTIVE IMPAIRMENTS: decreased activity tolerance, decreased coordination, decreased endurance, decreased mobility, decreased ROM, decreased strength, increased fascial restrictions, increased muscle spasms, impaired flexibility, impaired UE functional use, improper body mechanics, postural dysfunction, and pain.   ACTIVITY LIMITATIONS: carrying, lifting, sleeping, bathing, toileting, dressing, reach over head, hygiene/grooming, and locomotion level  PARTICIPATION LIMITATIONS: meal prep, cleaning, laundry, shopping,  community activity, and yard work  PERSONAL FACTORS: Age, Fitness, Past/current experiences, and Time since onset of injury/illness/exacerbation are also affecting patient's functional outcome.   REHAB POTENTIAL: Good  CLINICAL DECISION MAKING: Evolving/moderate complexity  EVALUATION COMPLEXITY: Moderate   GOALS: Goals reviewed with patient? Yes  SHORT TERM GOALS: Target date: 11/24/2024   Pt will be ind with initial HEP Baseline: Goal status: INITIAL  2.  Pt will report improved pain by >/=50% Baseline:  Goal status: INITIAL    LONG TERM GOALS: Target date: 12/15/2024   Pt will be ind with management and progression of HEP Baseline:  Goal status: INITIAL  2.  Pt will have improved QuickDASH to </=23 to demo MCID Baseline: QuickDASH Score: 38.6 / 100 = 38.6 % Goal status: INITIAL  3.  Pt will demo pain free and full shoulder motion Baseline:  Goal status: INITIAL  4.  Pt will report >/=75% improvement with her  sleep Baseline:  Goal status: INITIAL    PLAN:  PT FREQUENCY: 2x/week  PT DURATION: 6 weeks  PLANNED INTERVENTIONS: 02835- PT Re-evaluation, 97750- Physical Performance Testing, 97110-Therapeutic exercises, 97530- Therapeutic activity, [id number]- Neuromuscular re-education, 97535- Self Care, 02859- Manual therapy, G0283- Electrical stimulation (unattended), 97016- Vasopneumatic device, [id number]- Ultrasound, [id number]- Ionotophoresis 4mg /ml Dexamethasone , 79439 (1-2 muscles), 20561 (3+ muscles)- Dry Needling, Patient/Family education, Taping, Joint mobilization, Cryotherapy, and Moist heat  PLAN FOR NEXT SESSION: bicep stretching, Manual therapy for biceps, consider ionto/ultrasound and taping for biceps tendon, progressive strengthening of rotator cuff/posterior shoulder   Osbaldo Mark April Honor LITTIE Starring, PT, DPT 11/20/2024, 3:16 PM

## 2024-11-21 ENCOUNTER — Ambulatory Visit: Admitting: Physical Therapy

## 2024-11-21 DIAGNOSIS — M25512 Pain in left shoulder: Secondary | ICD-10-CM

## 2024-11-21 DIAGNOSIS — M6281 Muscle weakness (generalized): Secondary | ICD-10-CM

## 2024-11-21 NOTE — Therapy (Signed)
 OUTPATIENT PHYSICAL THERAPY SHOULDER TREATMENT  Patient Name: Audrey Adams MRN: [id number] DOB:12/01/1946, 78 y.o., female Today's Date: 11/21/2024  END OF SESSION:  PT End of Session - 11/21/24 1030     Visit Number 5    Number of Visits 12    Date for Recertification  12/15/24    Authorization Type [employer]    PT Start Time 1015    PT Stop Time 1107    PT Time Calculation (min) 52 min    Activity Tolerance Patient tolerated treatment well    Behavior During Therapy WFL for tasks assessed/performed            Past Medical History:  Diagnosis Date   Allergic rhinitis    Anxiety    Cancer (HCC)    lymphoma tx with stem cell transplant 2011   Complication of anesthesia    Hot flashes    PONV (postoperative nausea and vomiting)    Thrombocytopenia    Past Surgical History:  Procedure Laterality Date   ABDOMINAL HYSTERECTOMY     ANTERIOR CERVICAL DECOMP/DISCECTOMY FUSION Right 06/06/2017   Procedure: ANTERIOR CERVICAL DECOMPRESSION FUSION, CERVICAL 6-7 WITH INSTRUMENTATION AND ALLOGRAFT;  Surgeon: Beuford Anes, MD;  Location: [facility] OR;  Service: Orthopedics;  Laterality: Right;  ANTERIOR CERVICAL DECOMPRESSION FUSION, CERVICAL 6-7 WITH INSTRUMENTATION AND ALLOGRAFT; REQUEST 2 HOURS AND TO BE SECOND FLIP CASE OF THE DAY   BACK SURGERY     LIMBAL STEM CELL TRANSPLANT     PLANTAR FASCIA SURGERY Bilateral    10 years ago   Patient Active Problem List   Diagnosis Date Noted   Radiculopathy 06/06/2017    PCP: Wells Ditch, MD (Inactive)  REFERRING PROVIDER: Dozier Soulier, MD  REFERRING DIAG: [phone] Calcific tendinitis of left shoulder  THERAPY DIAG:  Left shoulder pain, unspecified chronicity  Muscle weakness (generalized)  Rationale for Evaluation and Treatment: Rehabilitation  ONSET DATE: ~1 year  SUBJECTIVE:                                                                                                                                                                                       SUBJECTIVE STATEMENT: Pain about a 3 today.    PERTINENT HISTORY: Neck surgery 2018, back surgery 2000  PAIN:  Are you having pain? Yes: NPRS scale: at rest 3/10, at worst 10/10 Pain location: Side of L arm Pain description: aching Aggravating factors: laying down and sleeping, lifting, pulling, overhead movement Relieving factors: heating pad, turmeric  PRECAUTIONS: None  RED FLAGS: None   WEIGHT BEARING RESTRICTIONS: No  FALLS:  Has patient fallen in last 6 months? Yes. Number of falls 2 --  slipped in mud and caught herself forward on her arms  LIVING ENVIRONMENT: Lives with: lives with their spouse Lives in: House/apartment Stairs: No Has following equipment at home: None  OCCUPATION: Retired -- Social Research Officer, Government, yard work, housework  PLOF: Independent  PATIENT GOALS:Decrease pain, sleep longer, improve movement  NEXT MD VISIT:   OBJECTIVE:  Note: Objective measures were completed at Evaluation unless otherwise noted.  DIAGNOSTIC FINDINGS:  MRI but unable to see reading  PATIENT SURVEYS:  Quick Dash:  QUICK DASH  Please rate your ability do the following activities in the last week by selecting the number below the appropriate response.   Activities Rating  Open a tight or new jar.  3 = Moderate difficulty  Do heavy household chores (e.g., wash walls, floors). 2 = Mild difficulty  Carry a shopping bag or briefcase 2 = Mild difficulty  Wash your back. 3 = Moderate difficulty  Use a knife to cut food. 2 = Mild difficulty  Recreational activities in which you take some force or impact through your arm, shoulder or hand (e.g., golf, hammering, tennis, etc.). 3 = Moderate difficulty  During the past week, to what extent has your arm, shoulder or hand problem interfered with your normal social activities with family, friends, neighbors or groups?  2 = Slightly  During the past week, were you limited in your work or other  regular daily activities as a result of your arm, shoulder or hand problem? 3 = Moderately limited  Rate the severity of the following symptoms in the last week: Arm, Shoulder, or hand pain. 3 = Moderate  Rate the severity of the following symptoms in the last week: Tingling (pins and needles) in your arm, shoulder or hand. 2 = Mild  During the past week, how much difficulty have you had sleeping because of the pain in your arm, shoulder or hand?  3 = Moderate difficulty  Score QuickDASH Score: 38.6 / 100 = 38.6 %   (A QuickDASH score may not be calculated if there is greater than 1 missing item.)  Minimally Clinically Important Difference (MCID): 15-20 points  (Franchignoni, F. et al. (2013). Minimally clinically important difference of the disabilities of the arm, shoulder, and hand outcome measures (DASH) and its shortened version (Quick DASH). Journal of Orthopaedic & Sports Physical Therapy, 44(1), 30-39)   COGNITION: Overall cognitive status: Within functional limits for tasks assessed     SENSATION: WFL  POSTURE: Flattened thoracic curve, rounded shoulders  UPPER EXTREMITY ROM: slight pain at all end range shoulder ROM on L in AROM and PROM  Active ROM Right eval Left eval  Shoulder flexion 140 140  Shoulder extension 70 65 p!  Shoulder abduction 150 145  Shoulder adduction    Shoulder internal rotation T6 T8  Shoulder external rotation 80 65  Elbow flexion    Elbow extension    Wrist flexion    Wrist extension    Wrist ulnar deviation    Wrist radial deviation    Wrist pronation    Wrist supination    (Blank rows = not tested)  UPPER EXTREMITY MMT:  MMT Right eval Left eval  Shoulder flexion 4 4-  Shoulder extension 4- 4  Shoulder abduction 4+ 4-  Shoulder adduction    Shoulder internal rotation 5 5  Shoulder external rotation 4+ 4-  Middle trapezius 3+ 4  Lower trapezius 3+ 4  Elbow flexion 5 5  Elbow extension 4 4  Wrist flexion    Wrist extension  Wrist ulnar deviation    Wrist radial deviation    Wrist pronation    Wrist supination    Grip strength (lbs)    (Blank rows = not tested)  SHOULDER SPECIAL TESTS: Did not formally assess  JOINT MOBILITY TESTING:  Joint mobility WNL  PALPATION:  TTP bicep tendon and trigger point notable in bicep muscle belly                                                                                                                             TREATMENT DATE:   11/21/24:   UBE x 8 minutes Pulleys x 5 minutes UE Ranger on wall x 5 minutes In supine:  PROM/stretching x 7 minutes HMP and IFC at 80-150 Hz on 40% scan x 20 minutes.    11/20/24: Nustep L4 x 10 min UEs/LEs Doorway pec stretch low, mid, high 2x30 each Bicep stretch against wall 2x30 Seated scap squeeze against ball 2x10 Seated tricep ext red TB Standing shoulder extension red TB 2x10 Standing row red TB 2x10 STM & TPR L bicep and brachialis Self care: shoulder positioning for sleep, shoulder anatomy     PATIENT EDUCATION: Education details: HEP Person educated: Patient Education method: Explanation, Demonstration, and Handouts Education comprehension: verbalized understanding, returned demonstration, and needs further education  HOME EXERCISE PROGRAM: Access Code: [email or web address] URL: [email or web address] Date: 11/20/2024 Prepared by: Gellen April Earnie Starring  Exercises - Doorway Pec Stretch at 60 Degrees Abduction with Arm Straight  - 2 x daily - 7 x weekly - 2 sets - 30 sec hold - Doorway Pec Stretch at 60 Elevation  - 2 x daily - 7 x weekly - 2 sets - 30 sec hold - Doorway Pec Stretch at 90 Degrees Abduction  - 2 x daily - 7 x weekly - 3 sets - 30 sec hold - Standing Bicep Stretch at Wall  - 2 x daily - 7 x weekly - 2 sets - 30 sec hold - Seated Elbow Extension with Self-Anchored Resistance  - 1 x daily - 7 x weekly - 2 sets - 10 reps - Shoulder extension with resistance - Neutral  - 1 x daily - 7  x weekly - 2 sets - 10 reps  ASSESSMENT:  CLINICAL IMPRESSION: Pain up a bit from stretching last session.  Patient is very motivated during her session.  She continues to report pain.    OBJECTIVE IMPAIRMENTS: decreased activity tolerance, decreased coordination, decreased endurance, decreased mobility, decreased ROM, decreased strength, increased fascial restrictions, increased muscle spasms, impaired flexibility, impaired UE functional use, improper body mechanics, postural dysfunction, and pain.   ACTIVITY LIMITATIONS: carrying, lifting, sleeping, bathing, toileting, dressing, reach over head, hygiene/grooming, and locomotion level  PARTICIPATION LIMITATIONS: meal prep, cleaning, laundry, shopping, community activity, and yard work  PERSONAL FACTORS: Age, Fitness, Past/current experiences, and Time since onset of injury/illness/exacerbation are also affecting patient's functional outcome.   REHAB POTENTIAL: Good  CLINICAL DECISION MAKING: Evolving/moderate complexity  EVALUATION COMPLEXITY: Moderate   GOALS: Goals reviewed with patient? Yes  SHORT TERM GOALS: Target date: 11/24/2024   Pt will be ind with initial HEP Baseline: Goal status: INITIAL  2.  Pt will report improved pain by >/=50% Baseline:  Goal status: INITIAL    LONG TERM GOALS: Target date: 12/15/2024   Pt will be ind with management and progression of HEP Baseline:  Goal status: INITIAL  2.  Pt will have improved QuickDASH to </=23 to demo MCID Baseline: QuickDASH Score: 38.6 / 100 = 38.6 % Goal status: INITIAL  3.  Pt will demo pain free and full shoulder motion Baseline:  Goal status: INITIAL  4.  Pt will report >/=75% improvement with her sleep Baseline:  Goal status: INITIAL    PLAN:  PT FREQUENCY: 2x/week  PT DURATION: 6 weeks  PLANNED INTERVENTIONS: 02835- PT Re-evaluation, 97750- Physical Performance Testing, 97110-Therapeutic exercises, 97530- Therapeutic activity, [id number]-  Neuromuscular re-education, 97535- Self Care, 02859- Manual therapy, G0283- Electrical stimulation (unattended), 97016- Vasopneumatic device, [id number]- Ultrasound, [id number]- Ionotophoresis 4mg /ml Dexamethasone , 79439 (1-2 muscles), 20561 (3+ muscles)- Dry Needling, Patient/Family education, Taping, Joint mobilization, Cryotherapy, and Moist heat  PLAN FOR NEXT SESSION: bicep stretching, Manual therapy for biceps, consider ionto/ultrasound and taping for biceps tendon, progressive strengthening of rotator cuff/posterior shoulder   Idona Stach, PT, DPT 11/21/2024, 11:35 AM

## 2024-11-25 ENCOUNTER — Ambulatory Visit: Admitting: Physical Therapy

## 2024-11-25 ENCOUNTER — Encounter: Payer: Self-pay | Admitting: Physical Therapy

## 2024-11-25 DIAGNOSIS — M25512 Pain in left shoulder: Secondary | ICD-10-CM

## 2024-11-25 DIAGNOSIS — M6281 Muscle weakness (generalized): Secondary | ICD-10-CM

## 2024-11-25 NOTE — Therapy (Signed)
 OUTPATIENT PHYSICAL THERAPY SHOULDER TREATMENT  Patient Name: Audrey Adams MRN: [id number] DOB:1946-06-23, 78 y.o., female Today's Date: 11/25/2024  END OF SESSION:  PT End of Session - 11/25/24 1341     Visit Number 6    Number of Visits 12    Date for Recertification  12/15/24    PT Start Time 0100    PT Stop Time 0158    PT Time Calculation (min) 58 min    Activity Tolerance Patient tolerated treatment well    Behavior During Therapy [facility] for tasks assessed/performed             Past Medical History:  Diagnosis Date   Allergic rhinitis    Anxiety    Cancer (HCC)    lymphoma tx with stem cell transplant 2011   Complication of anesthesia    Hot flashes    PONV (postoperative nausea and vomiting)    Thrombocytopenia    Past Surgical History:  Procedure Laterality Date   ABDOMINAL HYSTERECTOMY     ANTERIOR CERVICAL DECOMP/DISCECTOMY FUSION Right 06/06/2017   Procedure: ANTERIOR CERVICAL DECOMPRESSION FUSION, CERVICAL 6-7 WITH INSTRUMENTATION AND ALLOGRAFT;  Surgeon: Beuford Anes, MD;  Location: [facility] OR;  Service: Orthopedics;  Laterality: Right;  ANTERIOR CERVICAL DECOMPRESSION FUSION, CERVICAL 6-7 WITH INSTRUMENTATION AND ALLOGRAFT; REQUEST 2 HOURS AND TO BE SECOND FLIP CASE OF THE DAY   BACK SURGERY     LIMBAL STEM CELL TRANSPLANT     PLANTAR FASCIA SURGERY Bilateral    10 years ago   Patient Active Problem List   Diagnosis Date Noted   Radiculopathy 06/06/2017    PCP: Wells Ditch, MD (Inactive)  REFERRING PROVIDER: Dozier Soulier, MD  REFERRING DIAG: [phone] Calcific tendinitis of left shoulder  THERAPY DIAG:  Left shoulder pain, unspecified chronicity  Muscle weakness (generalized)  Rationale for Evaluation and Treatment: Rehabilitation  ONSET DATE: ~1 year  SUBJECTIVE:                                                                                                                                                                                       SUBJECTIVE STATEMENT: Horrible pain last night.  About a 4-5 today.   PERTINENT HISTORY: Neck surgery 2018, back surgery 2000  PAIN:  Are you having pain? Yes: NPRS scale: at rest 3/10, at worst 10/10 Pain location: Side of L arm Pain description: aching Aggravating factors: laying down and sleeping, lifting, pulling, overhead movement Relieving factors: heating pad, turmeric  PRECAUTIONS: None  RED FLAGS: None   WEIGHT BEARING RESTRICTIONS: No  FALLS:  Has patient fallen in last 6 months? Yes. Number of falls 2 -- slipped in mud  and caught herself forward on her arms  LIVING ENVIRONMENT: Lives with: lives with their spouse Lives in: House/apartment Stairs: No Has following equipment at home: None  OCCUPATION: Retired -- Social Research Officer, Government, yard work, housework  PLOF: Independent  PATIENT GOALS:Decrease pain, sleep longer, improve movement  NEXT MD VISIT:   OBJECTIVE:  Note: Objective measures were completed at Evaluation unless otherwise noted.  DIAGNOSTIC FINDINGS:  MRI but unable to see reading  PATIENT SURVEYS:  Quick Dash:  QUICK DASH  Please rate your ability do the following activities in the last week by selecting the number below the appropriate response.   Activities Rating  Open a tight or new jar.  3 = Moderate difficulty  Do heavy household chores (e.g., wash walls, floors). 2 = Mild difficulty  Carry a shopping bag or briefcase 2 = Mild difficulty  Wash your back. 3 = Moderate difficulty  Use a knife to cut food. 2 = Mild difficulty  Recreational activities in which you take some force or impact through your arm, shoulder or hand (e.g., golf, hammering, tennis, etc.). 3 = Moderate difficulty  During the past week, to what extent has your arm, shoulder or hand problem interfered with your normal social activities with family, friends, neighbors or groups?  2 = Slightly  During the past week, were you limited in your work or other regular daily  activities as a result of your arm, shoulder or hand problem? 3 = Moderately limited  Rate the severity of the following symptoms in the last week: Arm, Shoulder, or hand pain. 3 = Moderate  Rate the severity of the following symptoms in the last week: Tingling (pins and needles) in your arm, shoulder or hand. 2 = Mild  During the past week, how much difficulty have you had sleeping because of the pain in your arm, shoulder or hand?  3 = Moderate difficulty  Score QuickDASH Score: 38.6 / 100 = 38.6 %   (A QuickDASH score may not be calculated if there is greater than 1 missing item.)  Minimally Clinically Important Difference (MCID): 15-20 points  (Franchignoni, F. et al. (2013). Minimally clinically important difference of the disabilities of the arm, shoulder, and hand outcome measures (DASH) and its shortened version (Quick DASH). Journal of Orthopaedic & Sports Physical Therapy, 44(1), 30-39)   COGNITION: Overall cognitive status: Within functional limits for tasks assessed     SENSATION: WFL  POSTURE: Flattened thoracic curve, rounded shoulders  UPPER EXTREMITY ROM: slight pain at all end range shoulder ROM on L in AROM and PROM  Active ROM Right eval Left eval  Shoulder flexion 140 140  Shoulder extension 70 65 p!  Shoulder abduction 150 145  Shoulder adduction    Shoulder internal rotation T6 T8  Shoulder external rotation 80 65  Elbow flexion    Elbow extension    Wrist flexion    Wrist extension    Wrist ulnar deviation    Wrist radial deviation    Wrist pronation    Wrist supination    (Blank rows = not tested)  UPPER EXTREMITY MMT:  MMT Right eval Left eval  Shoulder flexion 4 4-  Shoulder extension 4- 4  Shoulder abduction 4+ 4-  Shoulder adduction    Shoulder internal rotation 5 5  Shoulder external rotation 4+ 4-  Middle trapezius 3+ 4  Lower trapezius 3+ 4  Elbow flexion 5 5  Elbow extension 4 4  Wrist flexion    Wrist extension    Wrist  ulnar  deviation    Wrist radial deviation    Wrist pronation    Wrist supination    Grip strength (lbs)    (Blank rows = not tested)  SHOULDER SPECIAL TESTS: Did not formally assess  JOINT MOBILITY TESTING:  Joint mobility WNL  PALPATION:  TTP bicep tendon and trigger point notable in bicep muscle belly                                                                                                                             TREATMENT DATE:   11/25/24:  UBE x 8 minutes f/b pulleys x 5 minutes f/b UE Ranger on wall x 5 minutes f/b RW 4 with red theraband to fatigue all directions f/b HMP and IFC at 80-150 Hz on 40% scan x 20 minutes.    11/21/24:   UBE x 8 minutes Pulleys x 5 minutes UE Ranger on wall x 5 minutes In supine:  PROM/stretching x 7 minutes HMP and IFC at 80-150 Hz on 40% scan x 20 minutes.    11/20/24: Nustep L4 x 10 min UEs/LEs Doorway pec stretch low, mid, high 2x30 each Bicep stretch against wall 2x30 Seated scap squeeze against ball 2x10 Seated tricep ext red TB Standing shoulder extension red TB 2x10 Standing row red TB 2x10 STM & TPR L bicep and brachialis Self care: shoulder positioning for sleep, shoulder anatomy     PATIENT EDUCATION: Education details: RW44.   Person educated: Patient Education method: Explanation, Demonstration, and Handouts Education comprehension: verbalized understanding, returned demonstration, and needs further education  HOME EXERCISE PROGRAM: Access Code: [email or web address] URL: [email or web address] Date: 11/20/2024 Prepared by: Gellen April Earnie Starring  Exercises - Doorway Pec Stretch at 60 Degrees Abduction with Arm Straight  - 2 x daily - 7 x weekly - 2 sets - 30 sec hold - Doorway Pec Stretch at 60 Elevation  - 2 x daily - 7 x weekly - 2 sets - 30 sec hold - Doorway Pec Stretch at 90 Degrees Abduction  - 2 x daily - 7 x weekly - 3 sets - 30 sec hold - Standing Bicep Stretch at Wall  - 2 x daily - 7 x weekly -  2 sets - 30 sec hold - Seated Elbow Extension with Self-Anchored Resistance  - 1 x daily - 7 x weekly - 2 sets - 10 reps - Shoulder extension with resistance - Neutral  - 1 x daily - 7 x weekly - 2 sets - 10 reps  ASSESSMENT:  CLINICAL IMPRESSION: Patient experienced a great deal of shoulder pain last night disturbing her sleep.  She did not meet LTG's #2, 3 and 4.  Added RW4 with red theraband to her HEP.    OBJECTIVE IMPAIRMENTS: decreased activity tolerance, decreased coordination, decreased endurance, decreased mobility, decreased ROM, decreased strength, increased fascial restrictions, increased muscle spasms, impaired flexibility, impaired UE functional use, improper body mechanics, postural dysfunction, and pain.   ACTIVITY  LIMITATIONS: carrying, lifting, sleeping, bathing, toileting, dressing, reach over head, hygiene/grooming, and locomotion level  PARTICIPATION LIMITATIONS: meal prep, cleaning, laundry, shopping, community activity, and yard work  PERSONAL FACTORS: Age, Fitness, Past/current experiences, and Time since onset of injury/illness/exacerbation are also affecting patient's functional outcome.   REHAB POTENTIAL: Good  CLINICAL DECISION MAKING: Evolving/moderate complexity  EVALUATION COMPLEXITY: Moderate   GOALS: Goals reviewed with patient? Yes  SHORT TERM GOALS: Target date: 11/24/2024   Pt will be ind with initial HEP Baseline: Goal status: INITIAL  2.  Pt will report improved pain by >/=50% Baseline:  Goal status: INITIAL    LONG TERM GOALS: Target date: 12/15/2024   Pt will be ind with management and progression of HEP Baseline:  Goal status: MET.  2.  Pt will have improved QuickDASH to </=23 to demo MCID Baseline: QuickDASH Score: 38.6 / 100 = 38.6 % Goal status: NOT MET. 3.  Pt will demo pain free and full shoulder motion Baseline:  Goal status: NOT MET.  4.  Pt will report >/=75% improvement with her sleep Baseline:  Goal status: NOT  MET    PLAN:  PT FREQUENCY: 2x/week  PT DURATION: 6 weeks  PLANNED INTERVENTIONS: 02835- PT Re-evaluation, 97750- Physical Performance Testing, 97110-Therapeutic exercises, 97530- Therapeutic activity, [id number]- Neuromuscular re-education, 97535- Self Care, 02859- Manual therapy, G0283- Electrical stimulation (unattended), 97016- Vasopneumatic device, [id number]- Ultrasound, [id number]- Ionotophoresis 4mg /ml Dexamethasone , 79439 (1-2 muscles), 20561 (3+ muscles)- Dry Needling, Patient/Family education, Taping, Joint mobilization, Cryotherapy, and Moist heat  PLAN FOR NEXT SESSION: bicep stretching, Manual therapy for biceps, consider ionto/ultrasound and taping for biceps tendon, progressive strengthening of rotator cuff/posterior shoulder   Ranjit Ashurst, PT, DPT 11/25/2024, 2:00 PM
